# Patient Record
Sex: Male | Born: 1940 | Race: White | Hispanic: No | Marital: Married | Smoking: Never smoker
Health system: Southern US, Community
[De-identification: ages and names within clinical notes are randomized; demographics above are authoritative.]

## PROBLEM LIST (undated history)

## (undated) ENCOUNTER — Encounter

## (undated) ENCOUNTER — Telehealth

## (undated) DIAGNOSIS — E785 Hyperlipidemia, unspecified: Secondary | ICD-10-CM

## (undated) DIAGNOSIS — I10 Essential (primary) hypertension: Secondary | ICD-10-CM

## (undated) DIAGNOSIS — M5416 Radiculopathy, lumbar region: Secondary | ICD-10-CM

## (undated) DIAGNOSIS — R7301 Impaired fasting glucose: Secondary | ICD-10-CM

## (undated) HISTORY — DX: Hyperlipidemia, unspecified: E78.5

## (undated) HISTORY — DX: Radiculopathy, lumbar region: M54.16

## (undated) HISTORY — DX: Essential (primary) hypertension: I10

## (undated) HISTORY — PX: COLONOSCOPY: SHX174

## (undated) HISTORY — DX: Impaired fasting glucose: R73.01

---

## 1996-11-21 HISTORY — PX: TOTAL HIP ARTHROPLASTY: SHX124

## 2000-09-06 ENCOUNTER — Encounter (INDEPENDENT_AMBULATORY_CARE_PROVIDER_SITE_OTHER): Payer: Self-pay | Admitting: Specialist

## 2000-09-06 ENCOUNTER — Other Ambulatory Visit: Admission: RE | Admit: 2000-09-06 | Discharge: 2000-09-06 | Payer: Self-pay | Admitting: Gastroenterology

## 2001-07-31 ENCOUNTER — Encounter: Payer: Self-pay | Admitting: Emergency Medicine

## 2001-07-31 ENCOUNTER — Emergency Department (HOSPITAL_COMMUNITY): Admission: EM | Admit: 2001-07-31 | Discharge: 2001-07-31 | Payer: Self-pay | Admitting: Emergency Medicine

## 2003-07-30 ENCOUNTER — Encounter: Admission: RE | Admit: 2003-07-30 | Discharge: 2003-07-30 | Payer: Self-pay | Admitting: Endocrinology

## 2003-07-30 ENCOUNTER — Encounter: Payer: Self-pay | Admitting: Endocrinology

## 2003-08-19 ENCOUNTER — Encounter: Admission: RE | Admit: 2003-08-19 | Discharge: 2003-08-19 | Payer: Self-pay | Admitting: Endocrinology

## 2003-08-19 ENCOUNTER — Encounter: Payer: Self-pay | Admitting: Endocrinology

## 2004-04-28 ENCOUNTER — Emergency Department (HOSPITAL_COMMUNITY): Admission: EM | Admit: 2004-04-28 | Discharge: 2004-04-28 | Payer: Self-pay | Admitting: Family Medicine

## 2004-12-23 ENCOUNTER — Ambulatory Visit: Payer: Self-pay | Admitting: Internal Medicine

## 2005-04-01 ENCOUNTER — Ambulatory Visit: Payer: Self-pay | Admitting: Internal Medicine

## 2005-07-27 ENCOUNTER — Ambulatory Visit: Payer: Self-pay | Admitting: Internal Medicine

## 2005-08-03 ENCOUNTER — Ambulatory Visit: Payer: Self-pay | Admitting: Internal Medicine

## 2005-08-15 ENCOUNTER — Ambulatory Visit: Payer: Self-pay | Admitting: Internal Medicine

## 2005-12-16 ENCOUNTER — Emergency Department (HOSPITAL_COMMUNITY): Admission: EM | Admit: 2005-12-16 | Discharge: 2005-12-16 | Payer: Self-pay | Admitting: Family Medicine

## 2006-05-16 ENCOUNTER — Ambulatory Visit: Payer: Self-pay | Admitting: Internal Medicine

## 2006-07-31 ENCOUNTER — Ambulatory Visit: Payer: Self-pay | Admitting: Internal Medicine

## 2007-05-02 ENCOUNTER — Encounter: Admission: RE | Admit: 2007-05-02 | Discharge: 2007-05-02 | Payer: Self-pay | Admitting: Sports Medicine

## 2007-05-29 ENCOUNTER — Encounter: Admission: RE | Admit: 2007-05-29 | Discharge: 2007-05-29 | Payer: Self-pay | Admitting: Sports Medicine

## 2007-06-18 ENCOUNTER — Encounter: Admission: RE | Admit: 2007-06-18 | Discharge: 2007-06-18 | Payer: Self-pay | Admitting: Sports Medicine

## 2007-06-25 ENCOUNTER — Encounter: Admission: RE | Admit: 2007-06-25 | Discharge: 2007-06-25 | Payer: Self-pay | Admitting: Sports Medicine

## 2007-08-08 ENCOUNTER — Ambulatory Visit: Payer: Self-pay | Admitting: Internal Medicine

## 2007-08-08 DIAGNOSIS — M109 Gout, unspecified: Secondary | ICD-10-CM | POA: Insufficient documentation

## 2007-08-08 LAB — CONVERTED CEMR LAB
Cholesterol, target level: 200 mg/dL
HDL goal, serum: 40 mg/dL
LDL Goal: 160 mg/dL

## 2007-08-09 ENCOUNTER — Telehealth (INDEPENDENT_AMBULATORY_CARE_PROVIDER_SITE_OTHER): Payer: Self-pay | Admitting: *Deleted

## 2007-08-09 ENCOUNTER — Encounter: Payer: Self-pay | Admitting: Internal Medicine

## 2007-08-11 LAB — CONVERTED CEMR LAB
ALT: 17 units/L (ref 0–53)
AST: 24 units/L (ref 0–37)
Alkaline Phosphatase: 63 units/L (ref 39–117)
BUN: 15 mg/dL (ref 6–23)
Basophils Relative: 0.4 % (ref 0.0–1.0)
CO2: 31 meq/L (ref 19–32)
Calcium: 9.4 mg/dL (ref 8.4–10.5)
Cholesterol: 107 mg/dL (ref 0–200)
Creatinine,U: 86 mg/dL
Eosinophils Absolute: 0 10*3/uL (ref 0.0–0.6)
GFR calc Af Amer: 96 mL/min
GFR calc non Af Amer: 80 mL/min
Glucose, Bld: 100 mg/dL — ABNORMAL HIGH (ref 70–99)
HDL: 35.7 mg/dL — ABNORMAL LOW (ref >39.0)
Hemoglobin: 12.7 g/dL — ABNORMAL LOW (ref 13.0–17.0)
Lymphocytes Relative: 27.5 % (ref 12.0–46.0)
MCHC: 34.6 g/dL (ref 30.0–36.0)
MCV: 96.5 fL (ref 78.0–100.0)
Monocytes Absolute: 0.3 10*3/uL (ref 0.2–0.7)
Monocytes Relative: 7.4 % (ref 3.0–11.0)
Neutro Abs: 3 10*3/uL (ref 1.4–7.7)
PSA: 1.56 ng/mL (ref 0.10–4.00)
Platelets: 159 10*3/uL (ref 150–400)
Potassium: 4.5 meq/L (ref 3.5–5.1)
TSH: 1.94 microintl units/mL (ref 0.35–5.50)
Total Bilirubin: 0.8 mg/dL (ref 0.3–1.2)
Triglycerides: 67 mg/dL (ref 0–149)
Uric Acid, Serum: 4.2 mg/dL (ref 2.4–7.0)

## 2007-08-13 ENCOUNTER — Encounter (INDEPENDENT_AMBULATORY_CARE_PROVIDER_SITE_OTHER): Payer: Self-pay | Admitting: *Deleted

## 2007-08-15 ENCOUNTER — Ambulatory Visit: Payer: Self-pay | Admitting: Internal Medicine

## 2007-08-16 ENCOUNTER — Encounter (INDEPENDENT_AMBULATORY_CARE_PROVIDER_SITE_OTHER): Payer: Self-pay | Admitting: *Deleted

## 2007-08-30 ENCOUNTER — Encounter (INDEPENDENT_AMBULATORY_CARE_PROVIDER_SITE_OTHER): Payer: Self-pay | Admitting: *Deleted

## 2007-08-30 ENCOUNTER — Encounter: Payer: Self-pay | Admitting: Internal Medicine

## 2007-09-07 ENCOUNTER — Telehealth (INDEPENDENT_AMBULATORY_CARE_PROVIDER_SITE_OTHER): Payer: Self-pay | Admitting: *Deleted

## 2007-10-17 ENCOUNTER — Ambulatory Visit: Payer: Self-pay | Admitting: Internal Medicine

## 2007-10-17 LAB — CONVERTED CEMR LAB
ALT: 25 units/L (ref 0–53)
Calcium: 9.6 mg/dL (ref 8.4–10.5)
Hgb A1c MFr Bld: 5.5 % (ref 4.6–6.0)

## 2007-10-19 ENCOUNTER — Ambulatory Visit: Payer: Self-pay | Admitting: Internal Medicine

## 2007-10-19 DIAGNOSIS — I10 Essential (primary) hypertension: Secondary | ICD-10-CM | POA: Insufficient documentation

## 2007-10-19 DIAGNOSIS — E785 Hyperlipidemia, unspecified: Secondary | ICD-10-CM | POA: Insufficient documentation

## 2007-10-19 DIAGNOSIS — IMO0002 Reserved for concepts with insufficient information to code with codable children: Secondary | ICD-10-CM | POA: Insufficient documentation

## 2007-10-21 LAB — CONVERTED CEMR LAB
Free T4: 0.8 ng/dL (ref 0.6–1.6)
TSH: 2.42 microintl units/mL (ref 0.35–5.50)

## 2007-10-22 ENCOUNTER — Encounter (INDEPENDENT_AMBULATORY_CARE_PROVIDER_SITE_OTHER): Payer: Self-pay | Admitting: *Deleted

## 2008-01-18 ENCOUNTER — Telehealth (INDEPENDENT_AMBULATORY_CARE_PROVIDER_SITE_OTHER): Payer: Self-pay | Admitting: *Deleted

## 2008-04-29 ENCOUNTER — Telehealth (INDEPENDENT_AMBULATORY_CARE_PROVIDER_SITE_OTHER): Payer: Self-pay | Admitting: *Deleted

## 2008-05-19 ENCOUNTER — Ambulatory Visit: Payer: Self-pay | Admitting: Internal Medicine

## 2008-05-19 LAB — CONVERTED CEMR LAB
ALT: 16 units/L (ref 0–53)
AST: 22 units/L (ref 0–37)
Albumin: 3.7 g/dL (ref 3.5–5.2)
Cholesterol: 150 mg/dL (ref 0–200)
HDL: 37.6 mg/dL — ABNORMAL LOW (ref >39.0)
Hgb A1c MFr Bld: 5.8 % (ref 4.6–6.0)
Total Protein: 6.8 g/dL (ref 6.0–8.3)
Triglycerides: 54 mg/dL (ref 0–149)

## 2008-05-26 ENCOUNTER — Ambulatory Visit: Payer: Self-pay | Admitting: Internal Medicine

## 2008-06-10 ENCOUNTER — Encounter: Payer: Self-pay | Admitting: Internal Medicine

## 2008-06-24 ENCOUNTER — Telehealth (INDEPENDENT_AMBULATORY_CARE_PROVIDER_SITE_OTHER): Payer: Self-pay | Admitting: *Deleted

## 2008-07-21 ENCOUNTER — Telehealth (INDEPENDENT_AMBULATORY_CARE_PROVIDER_SITE_OTHER): Payer: Self-pay | Admitting: *Deleted

## 2008-10-17 ENCOUNTER — Ambulatory Visit: Payer: Self-pay | Admitting: Internal Medicine

## 2008-10-17 LAB — CONVERTED CEMR LAB
ALT: 17 units/L (ref 0–53)
AST: 21 units/L (ref 0–37)
Bilirubin, Direct: 0.1 mg/dL (ref 0.0–0.3)
PSA: 1.57 ng/mL (ref 0.10–4.00)
Total Bilirubin: 0.8 mg/dL (ref 0.3–1.2)
Total CHOL/HDL Ratio: 3.4

## 2008-10-27 ENCOUNTER — Ambulatory Visit: Payer: Self-pay | Admitting: Internal Medicine

## 2008-10-27 DIAGNOSIS — R3589 Other polyuria: Secondary | ICD-10-CM | POA: Insufficient documentation

## 2008-10-27 DIAGNOSIS — R358 Other polyuria: Secondary | ICD-10-CM

## 2008-11-02 LAB — CONVERTED CEMR LAB
Creatinine,U: 66.4 mg/dL
Hemoglobin, Urine: NEGATIVE
Hgb A1c MFr Bld: 5.8 % (ref 4.6–6.0)
Leukocytes, UA: NEGATIVE
Mucus, UA: NEGATIVE
Nitrite: NEGATIVE
Squamous Epithelial / LPF: NEGATIVE /lpf
Total Protein, Urine: NEGATIVE mg/dL
WBC, UA: NONE SEEN cells/hpf
pH: 7.5 (ref 5.0–8.0)

## 2008-11-03 ENCOUNTER — Encounter (INDEPENDENT_AMBULATORY_CARE_PROVIDER_SITE_OTHER): Payer: Self-pay | Admitting: *Deleted

## 2008-11-09 ENCOUNTER — Inpatient Hospital Stay (HOSPITAL_COMMUNITY): Admission: AD | Admit: 2008-11-09 | Discharge: 2008-11-17 | Payer: Self-pay | Admitting: Orthopedic Surgery

## 2008-11-09 ENCOUNTER — Emergency Department (HOSPITAL_COMMUNITY): Admission: EM | Admit: 2008-11-09 | Discharge: 2008-11-09 | Payer: Self-pay | Admitting: Emergency Medicine

## 2008-11-17 ENCOUNTER — Encounter: Payer: Self-pay | Admitting: Internal Medicine

## 2008-11-18 ENCOUNTER — Telehealth (INDEPENDENT_AMBULATORY_CARE_PROVIDER_SITE_OTHER): Payer: Self-pay | Admitting: *Deleted

## 2008-12-24 ENCOUNTER — Encounter: Payer: Self-pay | Admitting: Internal Medicine

## 2009-01-22 ENCOUNTER — Telehealth (INDEPENDENT_AMBULATORY_CARE_PROVIDER_SITE_OTHER): Payer: Self-pay | Admitting: *Deleted

## 2009-04-02 ENCOUNTER — Telehealth (INDEPENDENT_AMBULATORY_CARE_PROVIDER_SITE_OTHER): Payer: Self-pay | Admitting: *Deleted

## 2009-05-17 IMAGING — CR DG FEMUR 2+V*R*
5 series · 5 of 5 positions shown · non-contrast
Comparison: 11/09/2008

CLINICAL DATA: Fracture proximal right femur

RIGHT FEMUR - 2 VIEW

[w hip lateral right * (1 of 3)]
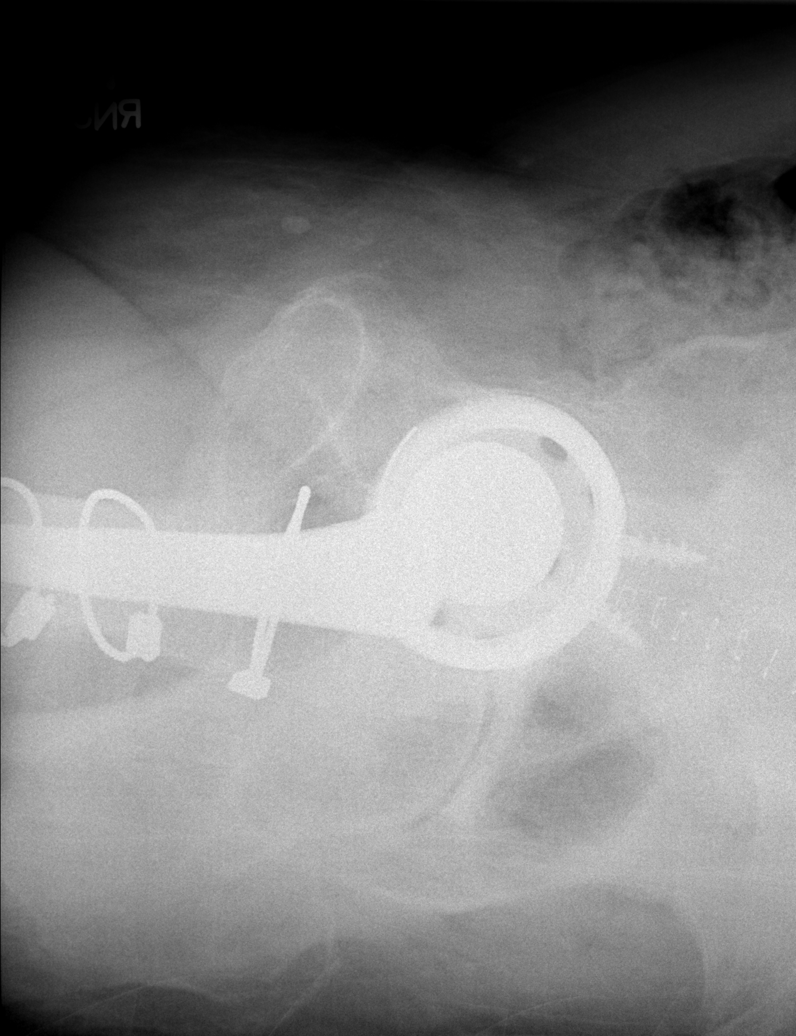

[w hip lateral right * (2 of 3)]
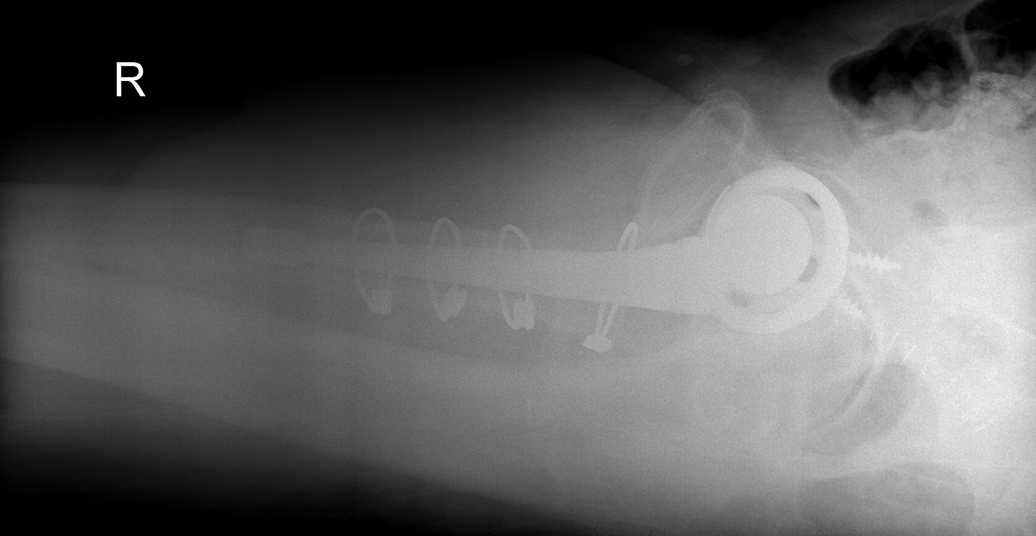

[w hip lateral right * (3 of 3)]
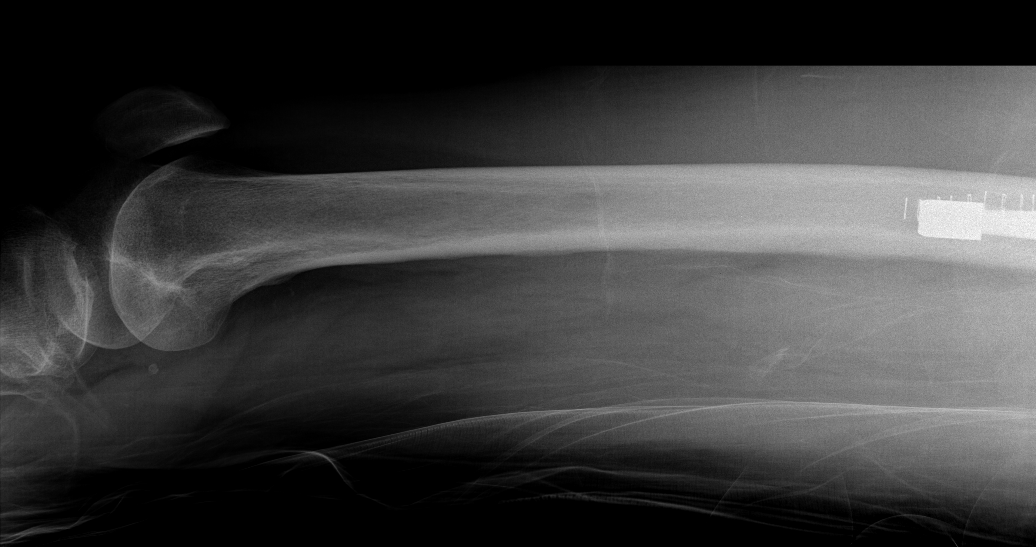

[t femur with hip  ap right]
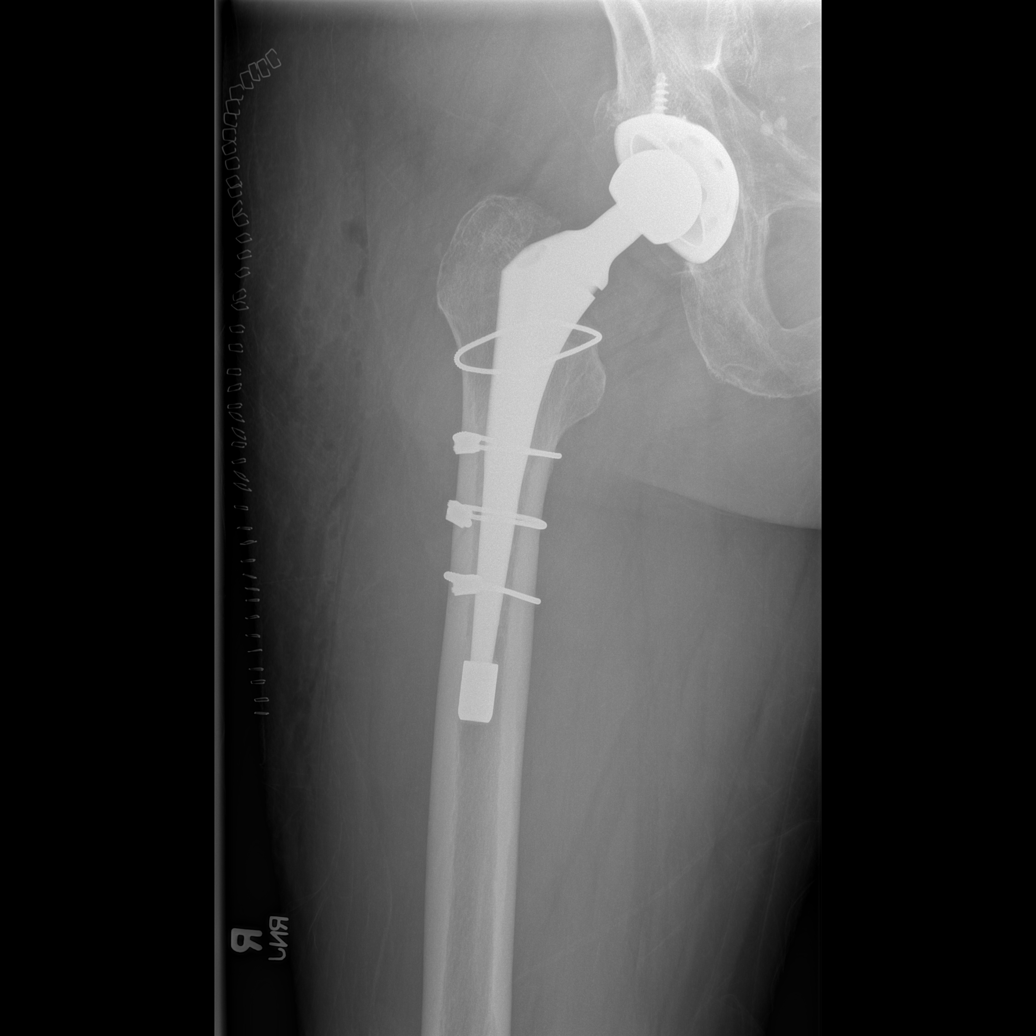

[t femur with knee ap right]
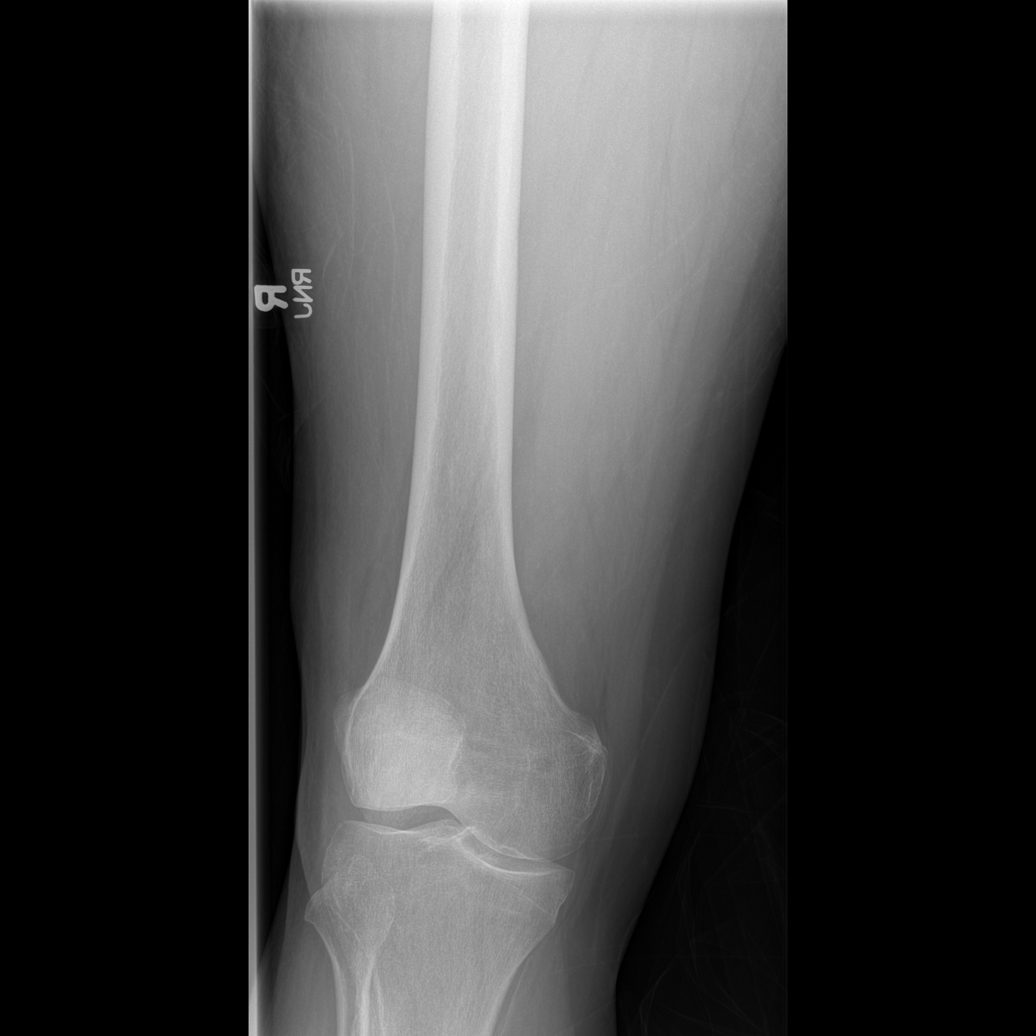

[5 of 5 positions shown; findings below may reference images not displayed]

FINDINGS: The fracture of the proximal right femoral shaft has been
reduced.  There are four cerclage wires encircling the proximal
right femur, the most proximal of which is in the intertrochanteric
region and three around the shaft of the femur.  Prosthetic
components are intact.  Satisfactory position and alignment of the
fracture fragments.
IMPRESSION: Satisfactory reduction of the proximal right femoral shaft
fracture.

## 2009-06-01 ENCOUNTER — Ambulatory Visit: Payer: Self-pay | Admitting: Internal Medicine

## 2009-06-06 LAB — CONVERTED CEMR LAB
Creatinine,U: 38.4 mg/dL
Hemoglobin, Urine: NEGATIVE
Leukocytes, UA: NEGATIVE
Microalb Creat Ratio: 2.6 mg/g (ref 0.0–30.0)
Microalb, Ur: 0.1 mg/dL (ref 0.0–1.9)
Nitrite: NEGATIVE
Specific Gravity, Urine: <=1.005 (ref 1.000–1.030)
Urine Glucose: NEGATIVE mg/dL
Urobilinogen, UA: 0.2 (ref 0.0–1.0)

## 2009-06-08 ENCOUNTER — Encounter (INDEPENDENT_AMBULATORY_CARE_PROVIDER_SITE_OTHER): Payer: Self-pay | Admitting: *Deleted

## 2009-07-22 ENCOUNTER — Telehealth (INDEPENDENT_AMBULATORY_CARE_PROVIDER_SITE_OTHER): Payer: Self-pay | Admitting: *Deleted

## 2009-07-24 ENCOUNTER — Telehealth (INDEPENDENT_AMBULATORY_CARE_PROVIDER_SITE_OTHER): Payer: Self-pay | Admitting: *Deleted

## 2009-11-09 ENCOUNTER — Ambulatory Visit: Payer: Self-pay | Admitting: Internal Medicine

## 2009-11-15 LAB — CONVERTED CEMR LAB
Albumin: 3.6 g/dL (ref 3.5–5.2)
BUN: 17 mg/dL (ref 6–23)
Cholesterol: 155 mg/dL (ref 0–200)
Creatinine, Ser: 0.9 mg/dL (ref 0.4–1.5)
HDL: 42.7 mg/dL (ref >39.00)
PSA: 2.05 ng/mL (ref 0.10–4.00)
Potassium: 4.4 meq/L (ref 3.5–5.1)
Total CHOL/HDL Ratio: 4
Total Protein: 6.3 g/dL (ref 6.0–8.3)
Triglycerides: 51 mg/dL (ref 0.0–149.0)

## 2009-11-16 ENCOUNTER — Encounter (INDEPENDENT_AMBULATORY_CARE_PROVIDER_SITE_OTHER): Payer: Self-pay | Admitting: *Deleted

## 2009-11-27 ENCOUNTER — Telehealth (INDEPENDENT_AMBULATORY_CARE_PROVIDER_SITE_OTHER): Payer: Self-pay | Admitting: *Deleted

## 2009-12-25 ENCOUNTER — Telehealth (INDEPENDENT_AMBULATORY_CARE_PROVIDER_SITE_OTHER): Payer: Self-pay | Admitting: *Deleted

## 2010-01-15 ENCOUNTER — Telehealth: Payer: Self-pay | Admitting: Internal Medicine

## 2010-01-25 ENCOUNTER — Telehealth (INDEPENDENT_AMBULATORY_CARE_PROVIDER_SITE_OTHER): Payer: Self-pay | Admitting: *Deleted

## 2010-02-11 ENCOUNTER — Telehealth (INDEPENDENT_AMBULATORY_CARE_PROVIDER_SITE_OTHER): Payer: Self-pay | Admitting: *Deleted

## 2010-02-26 ENCOUNTER — Telehealth: Payer: Self-pay | Admitting: Internal Medicine

## 2010-03-31 ENCOUNTER — Telehealth (INDEPENDENT_AMBULATORY_CARE_PROVIDER_SITE_OTHER): Payer: Self-pay | Admitting: *Deleted

## 2010-04-22 ENCOUNTER — Telehealth (INDEPENDENT_AMBULATORY_CARE_PROVIDER_SITE_OTHER): Payer: Self-pay | Admitting: *Deleted

## 2010-05-28 ENCOUNTER — Ambulatory Visit: Payer: Self-pay | Admitting: Internal Medicine

## 2010-06-07 LAB — CONVERTED CEMR LAB: Hgb A1c MFr Bld: 5.8 % (ref 4.6–6.5)

## 2010-06-29 ENCOUNTER — Ambulatory Visit: Payer: Self-pay | Admitting: Internal Medicine

## 2010-06-29 DIAGNOSIS — E119 Type 2 diabetes mellitus without complications: Secondary | ICD-10-CM | POA: Insufficient documentation

## 2010-06-30 ENCOUNTER — Telehealth (INDEPENDENT_AMBULATORY_CARE_PROVIDER_SITE_OTHER): Payer: Self-pay | Admitting: *Deleted

## 2010-07-09 ENCOUNTER — Telehealth: Payer: Self-pay | Admitting: Internal Medicine

## 2010-07-21 ENCOUNTER — Encounter (INDEPENDENT_AMBULATORY_CARE_PROVIDER_SITE_OTHER): Payer: Self-pay | Admitting: *Deleted

## 2010-07-23 ENCOUNTER — Telehealth (INDEPENDENT_AMBULATORY_CARE_PROVIDER_SITE_OTHER): Payer: Self-pay | Admitting: *Deleted

## 2010-09-13 ENCOUNTER — Telehealth (INDEPENDENT_AMBULATORY_CARE_PROVIDER_SITE_OTHER): Payer: Self-pay | Admitting: *Deleted

## 2010-10-07 ENCOUNTER — Encounter (INDEPENDENT_AMBULATORY_CARE_PROVIDER_SITE_OTHER): Payer: Self-pay | Admitting: *Deleted

## 2010-10-11 ENCOUNTER — Telehealth (INDEPENDENT_AMBULATORY_CARE_PROVIDER_SITE_OTHER): Payer: Self-pay | Admitting: *Deleted

## 2010-10-13 ENCOUNTER — Ambulatory Visit: Payer: Self-pay | Admitting: Internal Medicine

## 2010-10-13 DIAGNOSIS — R351 Nocturia: Secondary | ICD-10-CM | POA: Insufficient documentation

## 2010-10-13 DIAGNOSIS — R3915 Urgency of urination: Secondary | ICD-10-CM | POA: Insufficient documentation

## 2010-10-13 DIAGNOSIS — R7309 Other abnormal glucose: Secondary | ICD-10-CM | POA: Insufficient documentation

## 2010-10-13 DIAGNOSIS — R350 Frequency of micturition: Secondary | ICD-10-CM | POA: Insufficient documentation

## 2010-10-13 LAB — CONVERTED CEMR LAB
Blood in Urine, dipstick: NEGATIVE
Glucose, Urine, Semiquant: NEGATIVE
Ketones, urine, test strip: NEGATIVE
Nitrite: NEGATIVE
Specific Gravity, Urine: 1.01
pH: 6.5

## 2010-10-18 LAB — CONVERTED CEMR LAB: Hgb A1c MFr Bld: 5.7 % (ref 4.6–6.5)

## 2010-10-21 ENCOUNTER — Telehealth (INDEPENDENT_AMBULATORY_CARE_PROVIDER_SITE_OTHER): Payer: Self-pay | Admitting: *Deleted

## 2010-12-02 ENCOUNTER — Telehealth (INDEPENDENT_AMBULATORY_CARE_PROVIDER_SITE_OTHER): Payer: Self-pay | Admitting: *Deleted

## 2010-12-21 NOTE — Progress Notes (Signed)
Summary: Refill Request  Phone Note Refill Request Message from:  Pharmacy on [facility] Fax #: [phone]  Refills Requested: Medication #1:  CELEBREX 200 MG  CAPS 1 by mouth as needed ONLY   Dosage confirmed as above?Dosage Confirmed   Supply Requested: 1 month   Last Refilled: 01/25/2010 Next Appointment Scheduled: none Initial call taken by: Harold Barban,  February 26, 2010 8:38 AM  Follow-up for Phone Call        Dr.Hopper this was filled 1 month ago # 30, patient must be using daily. Is it ok to keep refilling med?? the indications is on the script (Not for regular use) Follow-up by: Shonna Chock,  February 26, 2010 9:32 AM  Additional Follow-up for Phone Call Additional follow up Details #1::        refill but CAPITALIZE as needed only due to long term cardiac & stomach risks Additional Follow-up by: Marga Melnick MD,  February 26, 2010 12:56 PM    New/Updated Medications: CELEBREX 200 MG  CAPS (CELECOXIB) 1 by mouth as needed ONLY, **AVOID REGULAR USE DUE TO INCREASE HEART AND STOMACH RISK** Prescriptions: CELEBREX 200 MG  CAPS (CELECOXIB) 1 by mouth as needed ONLY, **AVOID REGULAR USE DUE TO INCREASE HEART AND STOMACH RISK**  #30 x 0   Entered by:   Shonna Chock   Authorized by:   Marga Melnick MD   Signed by:   Shonna Chock on 02/26/2010   Method used:   Electronically to        [facility] (retail)       [address]       [place], [place]  [address]       Ph: [phone]       Fax: [phone]   RxID:   [id number]

## 2010-12-21 NOTE — Progress Notes (Signed)
Summary: Refill Request  Phone Note Refill Request Call back at [phone] Message from:  Pharmacy on April 22, 2010 10:09 AM  Refills Requested: Medication #1:  CELEBREX 200 MG  CAPS 1 by mouth as needed ONLY   Dosage confirmed as above?Dosage Confirmed   Supply Requested: 1 month   Last Refilled: 04/01/2010 [facility]  Next Appointment Scheduled: none Initial call taken by: Harold Barban,  April 22, 2010 10:09 AM    Prescriptions: CELEBREX 200 MG  CAPS (CELECOXIB) 1 by mouth as needed ONLY, **AVOID REGULAR USE DUE TO INCREASE HEART AND STOMACH RISK**  #60 x 1   Entered by:   Shonna Chock   Authorized by:   Marga Melnick MD   Signed by:   Shonna Chock on 04/22/2010   Method used:   Electronically to        [facility] (retail)       [address]       [place], [place]  [address]       Ph: [phone]       Fax: [phone]   RxID:   [id number]

## 2010-12-21 NOTE — Progress Notes (Signed)
Summary: RX---  Phone Note Refill Request   Refills Requested: Medication #1:  CELEBREX 200 MG  CAPS 1 by mouth once daily as needed only - avoid regular use due to GI [facility]--[phone]  Initial call taken by: Freddy Jaksch,  November 27, 2009 9:26 AM    Prescriptions: CELEBREX 200 MG  CAPS (CELECOXIB) 1 by mouth once daily as needed only - avoid regular use due to GI, cardiac risk longterm  #30 x 0   Entered by:   Kandice Hams   Authorized by:   Marga Melnick MD   Signed by:   Kandice Hams on 11/27/2009   Method used:   Faxed to ...       [facility] (retail)       [address]       [place], [place]  [address]       Ph: [phone]       Fax: [phone]   RxID:   [id number]

## 2010-12-21 NOTE — Letter (Signed)
Summary: Colonoscopy Letter  [facility]  [address] [place], [place] [address]   Phone: [phone]  Fax: [phone]      July 21, 2010 MRN: [id number]   FIDEL CAGGIANO [address] [place], [place]  [address]   Dear Mr. Mayon,   According to your medical record, it is time for you to schedule a Colonoscopy. The American Cancer Society recommends this procedure as a method to detect early colon cancer. Patients with a family history of colon cancer, or a personal history of colon polyps or inflammatory bowel disease are at increased risk.  This letter has beeen generated based on the recommendations made at the time of your procedure. If you feel that in your particular situation this may no longer apply, please contact our office.  Please call our office at [phone] to schedule this appointment or to update your records at your earliest convenience.  Thank you for cooperating with [place] to provide you with the very best care possible.   Sincerely,  Judie Petit T. Russella Dar, M.D.  [facility] [phone]

## 2010-12-21 NOTE — Letter (Signed)
Summary: Pre Visit Letter Revised  [facility]  [address] [place], [place] [address]   Phone: [phone]  Fax: [phone]        10/07/2010 Carl Wilcox Carl Wilcox [address] [place], [place]  [address]             Procedure Date:  11-10-10   Welcome to the Gastroenterology Division at [facility].    You are scheduled to see a nurse for your pre-procedure visit on 11-05-10 at 2:30p.m. on the 3rd floor at [facility], [address]  We ask that you try to arrive at our office 15 minutes prior to your appointment time to allow for check-in.  Please take a minute to review the attached form.  If you answer "Yes" to one or more of the questions on the first page, we ask that you call the person listed at your earliest opportunity.  If you answer "No" to all of the questions, please complete the rest of the form and bring it to your appointment.    Your nurse visit will consist of discussing your medical and surgical history, your immediate family medical history, and your medications.   If you are unable to list all of your medications on the form, please bring the medication bottles to your appointment and we will list them.  We will need to be aware of both prescribed and over the counter drugs.  We will need to know exact dosage information as well.    Please be prepared to read and sign documents such as consent forms, a financial agreement, and acknowledgement forms.  If necessary, and with your consent, a friend or relative is welcome to sit-in on the nurse visit with you.  Please bring your insurance card so that we may make a copy of it.  If your insurance requires a referral to see a specialist, please bring your referral form from your primary care physician.  No co-pay is required for this nurse visit.     If you cannot keep your appointment, please call [phone] to cancel or reschedule prior to your appointment date.  This allows  [place] the opportunity to schedule an appointment for another patient in need of care.    Thank you for choosing [facility] for your medical needs.  We appreciate the opportunity to care for you.  Please visit [place] at our website  to learn more about our practice.  Sincerely, The Gastroenterology Division

## 2010-12-21 NOTE — Progress Notes (Signed)
Summary: Refill-Request for (Celebrex)  Phone Note Refill Request Message from:  Fax from Pharmacy  Refills Requested: Medication #1:  CELEBREX 200 MG  CAPS 1 by mouth as needed ONLY [facility] - fax [phone] -- ph [phone]   Method Requested: Fax to Local Pharmacy Next Appointment Scheduled: none Initial call taken by: Okey Regal [place],  Mar 31, 2010 11:32 AM  Follow-up for Phone Call        Patient gets this filled every month, are you ok with #60 and refills? Follow-up by: Shonna Chock,  Mar 31, 2010 11:37 AM  Additional Follow-up for Phone Call Additional follow up Details #1::        OK but label "Routine use should be avoided due to GI & cardiac risks . It should taken as needed only to decrease long tewrm risks" Additional Follow-up by: Marga Melnick MD,  Mar 31, 2010 4:52 PM    Prescriptions: CELEBREX 200 MG  CAPS (CELECOXIB) 1 by mouth as needed ONLY, **AVOID REGULAR USE DUE TO INCREASE HEART AND STOMACH RISK**  #60 x 0   Entered by:   Shonna Chock   Authorized by:   Marga Melnick MD   Signed by:   Shonna Chock on 04/01/2010   Method used:   Electronically to        [facility] (retail)       [address]       [place], [place]  [address]       Ph: [phone]       Fax: [phone]   RxID:   [id number]

## 2010-12-21 NOTE — Progress Notes (Signed)
Summary: Losartan potassium refill  Phone Note Refill Request Message from:  Fax from Pharmacy on October 11, 2010 10:05 AM  Refills Requested: Medication #1:  COZAAR 50 MG  TABS 1 qd   Last Refilled: 07/09/2010 [facility], [address], [place]  [place], fax [phone]  Next Appointment Scheduled: wed 10/13/2010 Initial call taken by: Jerolyn Shin,  October 11, 2010 10:06 AM    Prescriptions: COZAAR 50 MG  TABS (LOSARTAN POTASSIUM) 1 qd  #90 x 1   Entered by:   Shonna Chock CMA   Authorized by:   Marga Melnick MD   Signed by:   Shonna Chock CMA on 10/11/2010   Method used:   Electronically to        [facility] (retail)       [address]       [place], [place]  [address]       Ph: [phone]       Fax: [phone]   RxID:   [id number]

## 2010-12-21 NOTE — Progress Notes (Signed)
Summary: Refill Request (Appointment Due)  Phone Note Refill Request Message from:  Fax from Pharmacy on February 11, 2010 2:22 PM  Refills Requested: Medication #1:  ACTOS 30 MG  TABS take one tablet daily [facility] fax [phone]   Method Requested: Fax to Local Pharmacy Next Appointment Scheduled: no appt Initial call taken by: Barb Merino,  February 11, 2010 2:23 PM  Follow-up for Phone Call        Left message on VM for patient to call to schedule appointment per last lab values from 10/2009, A1c was in a NON diabetic range and Dr.Hopper will need to adjust meds.   **Actos may be 1/2 or D/C'ed** Follow-up by: Shonna Chock,  February 11, 2010 3:05 PM  Additional Follow-up for Phone Call Additional follow up Details #1::        Left another message informing patient to please call to schedule appointment before refilling meds Additional Follow-up by: Shonna Chock,  February 12, 2010 9:50 AM

## 2010-12-21 NOTE — Progress Notes (Signed)
Summary: REFILL REQUEST  Phone Note Refill Request Call back at [phone] Message from:  Pharmacy on July 09, 2010 10:01 AM  Refills Requested: Medication #1:  COZAAR 50 MG  TABS 1 qd   Dosage confirmed as above?Dosage Confirmed   Supply Requested: 3 months [facility]  Next Appointment Scheduled: NONE Initial call taken by: Lavell Islam,  July 09, 2010 10:02 AM    Prescriptions: COZAAR 50 MG  TABS (LOSARTAN POTASSIUM) 1 qd  #90 x 0   Entered by:   Lucious Groves CMA   Authorized by:   Marga Melnick MD   Signed by:   Lucious Groves CMA on 07/09/2010   Method used:   Faxed to ...       [facility] (retail)       [address]       [place], [place]  [address]       Ph: [phone]       Fax: [phone]   RxID:   [id number]

## 2010-12-21 NOTE — Progress Notes (Signed)
Summary: Refill Request  Phone Note Refill Request Message from:  [facility] # [phone]  Refills Requested: Medication #1:  CELEBREX 200 MG  CAPS 1 by mouth once daily as needed only - avoid regular use due to GI   Dosage confirmed as above?Dosage Confirmed   Last Refilled: 11/27/2009 Initial call taken by: Harold Barban,  December 25, 2009 9:24 AM    New/Updated Medications: CELEBREX 200 MG  CAPS (CELECOXIB) 1 by mouth as needed ONLY, AVOID REGULAR USE DUE TO INCREASE HEART AND STOMACH RISK Prescriptions: CELEBREX 200 MG  CAPS (CELECOXIB) 1 by mouth as needed ONLY, AVOID REGULAR USE DUE TO INCREASE HEART AND STOMACH RISK  #30 x 0   Entered by:   Shonna Chock   Authorized by:   Marga Melnick MD   Signed by:   Shonna Chock on 12/25/2009   Method used:   Electronically to        [facility] (retail)       [address]       [place], [place]  [id number]       Ph: [phone]       Fax: [phone]   RxID:   [id number]

## 2010-12-21 NOTE — Progress Notes (Signed)
Summary: refill  Phone Note Refill Request Message from:  Fax from Pharmacy on July 23, 2010 1:56 PM  Refills Requested: Medication #1:  SIMVASTATIN 40 MG  TABS 1 qhs. [facility] Valinda Hoar [id number]  Initial call taken by: Okey Regal [place],  July 23, 2010 1:56 PM    Prescriptions: SIMVASTATIN 40 MG  TABS (SIMVASTATIN) 1 qhs  #90 Tablet x 0   Entered by:   Shonna Chock CMA   Authorized by:   Marga Melnick MD   Signed by:   Shonna Chock CMA on 07/23/2010   Method used:   Electronically to        [facility] (retail)       [address]       [place], [place]  [id number]       Ph: [phone]       Fax: [phone]   RxID:   [id number]

## 2010-12-21 NOTE — Progress Notes (Signed)
Summary: refill  Phone Note Refill Request Message from:  Fax from Pharmacy on January 25, 2010 9:04 AM  Refills Requested: Medication #1:  SIMVASTATIN 40 MG  TABS 1 qhs.  Medication #2:  CELEBREX 200 MG  CAPS 1 by mouth as needed ONLY [facility] fax [phone]   Method Requested: Fax to Local Pharmacy Next Appointment Scheduled: no appt Initial call taken by: Barb Merino,  January 25, 2010 9:05 AM    Prescriptions: CELEBREX 200 MG  CAPS (CELECOXIB) 1 by mouth as needed ONLY, AVOID REGULAR USE DUE TO INCREASE HEART AND STOMACH RISK  #30 x 0   Entered by:   Shonna Chock   Authorized by:   Marga Melnick MD   Signed by:   Shonna Chock on 01/25/2010   Method used:   Electronically to        [facility] (retail)       [address]       [place], [place]  [address]       Ph: [phone]       Fax: [phone]   RxID:   [id number] SIMVASTATIN 40 MG  TABS (SIMVASTATIN) 1 qhs  #90 Tablet x 1   Entered by:   Shonna Chock   Authorized by:   Marga Melnick MD   Signed by:   Shonna Chock on 01/25/2010   Method used:   Electronically to        [facility] (retail)       [address]       [place], [place]  [address]       Ph: [phone]       Fax: [phone]   RxID:   [id number]

## 2010-12-21 NOTE — Assessment & Plan Note (Signed)
Summary: frequent urination/cbs   Vital Signs:  Patient profile:   70 year old male Weight:      206 pounds Temp:     98.4 degrees F oral Pulse rate:   60 / minute Resp:     16 per minute BP sitting:   118 / 80  (left arm) Cuff size:   large  Vitals Entered By: Shonna Chock CMA (October 13, 2010 8:09 AM) CC: Frequent Urination x 1 year, Dysuria, Type 2 diabetes mellitus follow-up   CC:  Frequent Urination x 1 year, Dysuria, and Type 2 diabetes mellitus follow-up.  History of Present Illness:      This is a 70 year old man who presents with frequency X 12 months, worse over 6 months..  The patient reports urinary frequency , urgency ,& nocturia X1. He  denies burning with urination, hematuria, and penile discharge.  The patient denies the following associated symptoms: nausea, flank pain, abdominal pain, back pain, and pelvic pain.  Risk factors for urinary tract infection include PMH of  diabetes.  The patient denies the following risk factors: history of GU anomaly.   He is concerned because he was on Actos for 2-3 years due to media reports of potential of bladder cancer. No ED symptoms. No  FH of G-U disease.       The patient denies polydipsia, blurred vision, weight loss, weight gain, and numbness of extremities.  The patient  also denies the following symptoms: neuropathic pain, chest pain, vomiting, orthostatic symptoms, poor wound healing, vision loss, and foot ulcer.  Since the last visit the patient reports fair  dietary compliance, exercising regularly ( walking 4X/week), and not monitoring blood glucose.  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 A1c was 5.8% in 07.2011; Actos was D/Ced then.  Current Medications (verified): 1)  Allopurinol 300 Mg  Tabs (Allopurinol) .... Take One Tablet Daily 2)  Freestyle Test   Strp (Glucose Blood) .... Test As Directed 3)  Unilet Excelite Ii   Misc (Lancets) .... Take  As Directed 4)  Celebrex 200 Mg  Caps (Celecoxib) .Marland Kitchen.. 1 By Mouth As Needed Only, **avoid Regular Use Due To Increase Heart and Stomach Risk** 5)  Viagra 100 Mg  Tabs (Sildenafil Citrate) .... 1/2-1 Once Daily Prn 6)  Cozaar 50 Mg  Tabs (Losartan Potassium) .Marland Kitchen.. 1 Qd 7)  Aspirin Adult Low Strength 81 Mg  Tbec (Aspirin) .Marland Kitchen.. 1 By Mouth Once Daily 8)  Simvastatin 40 Mg  Tabs (Simvastatin) .Marland Kitchen.. 1 Qhs  Allergies: 1)  ! * Lotrel 2)  Codeine  Past History:  Past Medical History: Diabetes mellitus, type II: A1c 6.3% in 08/2004; A1c 5.8% ( NON Diabetic) 05/2010 Gout Hyperlipidemia Hypertension R Lumbar  Radiculopathy; DDD of LS spine  Dr Harrell Lark  Physical Exam  General:  well-nourished;alert,appropriate and cooperative throughout examination Lungs:  Normal respiratory effort, chest expands symmetrically. Lungs are clear to auscultation, no crackles or wheezes. Heart:  Normal rate and regular rhythm. S1 and S2 normal without gallop, murmur, click, rub .S4 Abdomen:  Bowel sounds positive,abdomen soft and non-tender without masses, organomegaly or hernias noted. Rectal:  No external abnormalities noted. Normal sphincter tone. No rectal masses or tenderness. Genitalia:  Testes bilaterally descended without nodularity, tenderness or masses. No scrotal masses or lesions. No penis lesions or urethral discharge. L varicocele.   Prostate:  Broad & flat Pulses:  R and L carotid,radial,dorsalis pedis  and posterior tibial pulses are full and equal bilaterally Extremities:  No clubbing, cyanosis, edema. Minor nail fungal changes Neurologic:  alert & oriented X3 and sensation intact to light touch over feet .   Skin:  Dry skin over shins Psych:  memory intact for recent and remote, normally interactive, and good eye contact.     Impression & Recommendations:  Problem # 1:  URINARY FREQUENCY (ICD-788.41)  Orders: UA Dipstick W/ Micro (manual) ([address]109) Venipuncture (60454) TLB-PSA (Prostate  Specific Antigen) (84153-PSA) Prescription Created Electronically [phone])  His updated medication list for this problem includes:    Tamsulosin Hcl 0.4 Mg Caps (Tamsulosin hcl) .Marland Kitchen... 1 once daily  Problem # 2:  URINARY URGENCY (BJY-782.95)  Orders: Venipuncture (62130) TLB-PSA (Prostate Specific Antigen) (84153-PSA) Prescription Created Electronically [phone])  Problem # 3:  NOCTURIA (ION-629.52)  Orders: Venipuncture (84132) TLB-PSA (Prostate Specific Antigen) (84153-PSA) Prescription Created Electronically [phone])  Problem # 4:  HYPERGLYCEMIA, MILD (ICD-790.29)  Orders: Venipuncture (27253) TLB-A1C / Hgb A1C (Glycohemoglobin) (83036-A1C)  Complete Medication List: 1)  Allopurinol 300 Mg Tabs (Allopurinol) .... Take one tablet daily 2)  Freestyle Test Strp (Glucose blood) .... Test as directed 3)  Unilet Excelite Ii Misc (Lancets) .... Take as directed 4)  Celebrex 200 Mg Caps (Celecoxib) .Marland Kitchen.. 1 by mouth as needed only, **avoid regular use due to increase heart and stomach risk** 5)  Viagra 100 Mg Tabs (Sildenafil citrate) .... 1/2-1 once daily prn 6)  Cozaar 50 Mg Tabs (Losartan potassium) .Marland Kitchen.. 1 qd 7)  Aspirin Adult Low Strength 81 Mg Tbec (Aspirin) .Marland Kitchen.. 1 by mouth once daily 8)  Simvastatin 40 Mg Tabs (Simvastatin) .Marland Kitchen.. 1 qhs 9)  Tamsulosin Hcl 0.4 Mg Caps (Tamsulosin hcl) .Marland Kitchen.. 1 once daily  Patient Instructions: 1)  It is important that your Diabetic A1c level is checked every 6  months. 2)  See your eye doctor yearly to check for diabetic eye damage. 3)  Check your feet each night for sore areas, calluses or signs of infection. Prescriptions: TAMSULOSIN HCL 0.4 MG CAPS (TAMSULOSIN HCL) 1 once daily  #30 x 5   Entered and Authorized by:   Marga Melnick MD   Signed by:   Marga Melnick MD on 10/13/2010   Method used:   Faxed to ...       [facility] (retail)       [address]       [place], [place]  [address]       Ph: [phone]        Fax: [phone]   RxID:   [id number]    Orders Added: 1)  UA Dipstick W/ Micro (manual) [81000] 2)  Est. Patient Level IV [55732] 3)  Venipuncture [20254] 4)  TLB-PSA (Prostate Specific Antigen) [84153-PSA] 5)  TLB-A1C / Hgb A1C (Glycohemoglobin) [83036-A1C] 6)  Prescription Created Electronically [id number]     Laboratory Results   Urine Tests    Routine Urinalysis   Color: yellow Appearance: Clear Glucose: negative   (Normal Range: Negative) Bilirubin: negative   (Normal Range: Negative) Ketone: negative   (Normal Range: Negative) Spec. Gravity: 1.010   (Normal Range: 1.003-1.035) Blood: negative   (Normal Range: Negative) pH: 6.5   (Normal Range: 5.0-8.0) Protein: negative   (Normal Range: Negative) Urobilinogen: 0.2   (Normal Range: 0-1) Nitrite: negative   (Normal Range: Negative) Leukocyte Esterace: negative   (Normal Range: Negative)       Appended Document: frequent urination/cbs

## 2010-12-21 NOTE — Assessment & Plan Note (Signed)
Summary: follow up//ph   Vital Signs:  Patient profile:   70 year old male Weight:      201.4 pounds Pulse rate:   60 / minute Resp:     14 per minute BP sitting:   138 / 86  (left arm) Cuff size:   large  Vitals Entered By: Shonna Chock CMA (June 29, 2010 8:05 AM) CC: follow-up visit, Type 2 diabetes mellitus follow-up   CC:  follow-up visit and Type 2 diabetes mellitus follow-up.  History of Present Illness:  Type 2 Diabetes Mellitus Follow-Up      This is a 70 year old man who presents for Type 2 diabetes mellitus follow-up.  The patient reports purposeful weight loss of 5# over past year  and numbness of hands only positionally  @ night, but denies polyuria, polydipsia, blurred vision, and self managed hypoglycemia.  The patient denies the following symptoms: neuropathic pain, chest pain, vomiting, orthostatic symptoms, poor wound healing, intermittent claudication, vision loss, and foot ulcer.  Since the last visit the patient reports good dietary compliance.  The patient has been measuring capillary blood glucose before breakfast, range 90-100.  Since the last visit, the patient reports having had eye care by an Ophthalmologist; no retinopathy. A1c was 5.8 % off Actos for 6 months. Serial A1cs reviewed  ; A1c was 6.3% in 08/2004 but it has been controlled since. Maximal A1c after 2005 was 6% in 07/2007. CVE as walking 60 min 5X/week.   Current Medications (verified): 1)  Allopurinol 300 Mg  Tabs (Allopurinol) .... Take One Tablet Daily 2)  Freestyle Test   Strp (Glucose Blood) .... Test As Directed 3)  Unilet Excelite Ii   Misc (Lancets) .... Take As Directed 4)  Celebrex 200 Mg  Caps (Celecoxib) .Marland Kitchen.. 1 By Mouth As Needed Only, **avoid Regular Use Due To Increase Heart and Stomach Risk** 5)  Viagra 100 Mg  Tabs (Sildenafil Citrate) .... 1/2-1 Once Daily Prn 6)  Cozaar 50 Mg  Tabs (Losartan Potassium) .Marland Kitchen.. 1 Qd 7)  Aspirin Adult Low Strength 81 Mg  Tbec (Aspirin) .Marland Kitchen.. 1 By Mouth  Once Daily 8)  Simvastatin 40 Mg  Tabs (Simvastatin) .Marland Kitchen.. 1 Qhs  Allergies: 1)  ! * Lotrel 2)  Codeine  Past History:  Past Medical History: Diabetes mellitus, type II: A1c 6.3% in 08/2004 Gout Hyperlipidemia Hypertension R Lumbar  Radiculopathy; DDD of LS spine  Dr Harrell Lark  Physical Exam  General:  well-nourished; alert,appropriate and cooperative throughout examination Lungs:  Normal respiratory effort, chest expands symmetrically. Lungs are clear to auscultation, no crackles or wheezes. Heart:  Normal rate and regular rhythm. S1 and S2 normal without gallop, murmur, click, rub or other extra sounds. Pulses:  R and L carotid,radial,dorsalis pedis and posterior tibial pulses are full and equal bilaterally Extremities:  No clubbing, cyanosis, edema.Chronic great toe nail changes  Neurologic:  alert & oriented X3 and sensation intact to light touch over feet.   Skin:  Intact without suspicious lesions or rashes   Impression & Recommendations:  Problem # 1:  DIABETES MELLITUS, TYPE II, CONTROLLED (ICD-250.00) Resolved with TLC ( nutrition & exercise) w/o meds The following medications were removed from the medication list:    Actos 30 Mg Tabs (Pioglitazone hcl) .Marland Kitchen... Take one tablet daily His updated medication list for this problem includes:    Cozaar 50 Mg Tabs (Losartan potassium) .Marland Kitchen... 1 qd    Aspirin Adult Low Strength 81 Mg Tbec (Aspirin) .Marland Kitchen... 1 by  mouth once daily  Complete Medication List: 1)  Allopurinol 300 Mg Tabs (Allopurinol) .... Take one tablet daily 2)  Freestyle Test Strp (Glucose blood) .... Test as directed 3)  Unilet Excelite Ii Misc (Lancets) .... Take as directed 4)  Celebrex 200 Mg Caps (Celecoxib) .Marland Kitchen.. 1 by mouth as needed only, **avoid regular use due to increase heart and stomach risk** 5)  Viagra 100 Mg Tabs (Sildenafil citrate) .... 1/2-1 once daily prn 6)  Cozaar 50 Mg Tabs (Losartan potassium) .Marland Kitchen.. 1 qd 7)  Aspirin Adult Low Strength 81 Mg  Tbec (Aspirin) .Marland Kitchen.. 1 by mouth once daily 8)  Simvastatin 40 Mg Tabs (Simvastatin) .Marland Kitchen.. 1 qhs  Patient Instructions: 1)  Monitor lipids , A1c & uric acid  annually. Consume < 40 grams of High Fructose Corn Syrup sugar/ day as discussed.

## 2010-12-21 NOTE — Progress Notes (Signed)
Summary: refill  Phone Note Refill Request Message from:  Fax from Pharmacy on June 30, 2010 4:52 PM  Refills Requested: Medication #1:  CELEBREX 200 MG  CAPS 1 by mouth as needed ONLY [facility] - fax [phone]  Initial call taken by: Okey Regal [place],  June 30, 2010 4:53 PM    Prescriptions: CELEBREX 200 MG  CAPS (CELECOXIB) 1 by mouth as needed ONLY, **AVOID REGULAR USE DUE TO INCREASE HEART AND STOMACH RISK**  #60 x 1   Entered by:   Shonna Chock CMA   Authorized by:   Marga Melnick MD   Signed by:   Shonna Chock CMA on 07/01/2010   Method used:   Electronically to        [facility] (retail)       [address]       [place], [place]  [id number]       Ph: [phone]       Fax: [phone]   RxID:   [id number]

## 2010-12-21 NOTE — Progress Notes (Signed)
Summary: Refill Request  Phone Note Refill Request Call back at Home Phone [phone] Message from:  Pharmacy on [facility] Fax #: [phone]  Refills Requested: Medication #1:  ACTOS 30 MG  TABS take one tablet daily   Dosage confirmed as above?Dosage Confirmed   Supply Requested: 1 month   Last Refilled: 12/17/2009 Initial call taken by: Harold Barban,  January 15, 2010 3:51 PM  Follow-up for Phone Call        pt is currently in [place] and will not return back to [facility] until middle of May.per last labs A1c is well within NON Diabetes range. Please see me with all glucose recordings before refilling Actos. do you want to continue filling med until then.  Pt has a 5 day supply left of med.  pls advise...........Marland KitchenFelecia Deloach CMA  January 15, 2010 5:05 PM   Additional Follow-up for Phone Call Additional follow up Details #1::        Stop Actos; recheck fasting lipids, hepatic panel, A1c, BUN,creat , K+ 3-4 months off Actos (478.29,562.13) Additional Follow-up by: Marga Melnick MD,  January 18, 2010 6:21 PM    Additional Follow-up for Phone Call Additional follow up Details #2::    pt aware...............Marland KitchenFelecia Deloach CMA  January 19, 2010 8:41 AM

## 2010-12-21 NOTE — Progress Notes (Signed)
Summary: ALLOPURINOL REFILL  Phone Note Refill Request Message from:  Fax from Pharmacy on September 13, 2010 11:19 AM  Refills Requested: Medication #1:  ALLOPURINOL 300 MG  TABS take one tablet daily [facility], [address] [phone]  Initial call taken by: Jerolyn Shin,  September 13, 2010 11:31 AM    Prescriptions: ALLOPURINOL 300 MG  TABS (ALLOPURINOL) take one tablet daily  #90 x 2   Entered by:   Shonna Chock CMA   Authorized by:   Marga Melnick MD   Signed by:   Shonna Chock CMA on 09/13/2010   Method used:   Electronically to        [facility] (retail)       [address]       [place], [place]  [id number]       Ph: [phone]       Fax: [phone]   RxID:   [id number]

## 2010-12-21 NOTE — Progress Notes (Signed)
Summary: FYI: Pharmacy Change  Phone Note Call from Patient   Caller: Patient Summary of Call: PT WANTS TO LET DR.HOPPER KNOW THAT HE WILL BE CHANGING PHARMACYS TO  [facility] [address] [facility] Initial call taken by: Lavell Islam,  October 21, 2010 10:47 AM  Follow-up for Phone Call        Noted, changed on pharmacy contact list Follow-up by: Shonna Chock CMA,  October 21, 2010 2:09 PM

## 2010-12-21 NOTE — Letter (Signed)
Summary: Results Follow up Letter  [facility]  [address] [place], [place] [address]   Phone: [phone]  Fax: [phone]    06/08/2009 MRN: [id number]  Carl Wilcox [address] [place], [place]  [address]  Dear [name],  The following are the results of your recent test(s):  Test         Result    Pap Smear:        Normal _____  Not Normal _____ Comments: ______________________________________________________ Cholesterol: LDL(Bad cholesterol):         Your goal is less than:         HDL (Good cholesterol):       Your goal is more than: Comments:  ______________________________________________________ Mammogram:        Normal _____  Not Normal _____ Comments:  ___________________________________________________________________ Hemoccult:        Normal _____  Not normal _______ Comments:    _____________________________________________________________________ Other Tests: PLEASE SEE ATTACHED LABS DONE ON 06/01/2009    We routinely do not discuss normal results over the telephone.  If you desire a copy of the results, or you have any questions about this information we can discuss them at your next office visit.   Sincerely,

## 2010-12-23 NOTE — Progress Notes (Signed)
Summary: Refill Request  Phone Note Refill Request Call back at [phone] Message from:  Pharmacy on December 02, 2010 1:17 PM  Refills Requested: Medication #1:  SIMVASTATIN 40 MG  TABS 1 qhs   Dosage confirmed as above?Dosage Confirmed   Supply Requested: 30   Last Refilled: 11/03/2010 [facility] [address] in [place], [place]  Next Appointment Scheduled: none Initial call taken by: Harold Barban,  December 02, 2010 1:24 PM    Prescriptions: SIMVASTATIN 40 MG  TABS (SIMVASTATIN) 1 qhs  #30 x 0   Entered by:   Shonna Chock CMA   Authorized by:   Marga Melnick MD   Signed by:   Shonna Chock CMA on 12/02/2010   Method used:   Print then Give to Patient   RxID:   [id number]

## 2011-01-04 ENCOUNTER — Telehealth (INDEPENDENT_AMBULATORY_CARE_PROVIDER_SITE_OTHER): Payer: Self-pay | Admitting: *Deleted

## 2011-01-12 NOTE — Progress Notes (Addendum)
Summary: [place]Simvastatin  Phone Note Refill Request Message from:  Fax from Pharmacy on January 04, 2011 12:07 PM  Refills Requested: Medication #1:  SIMVASTATIN 40 MG  TABS 1 qhs   Supply Requested: 30   Last Refilled: 12/02/2010 [facility] [place] [phone] fax  [phone]  Next Appointment Scheduled: NONE Initial call taken by: Lucious Groves CMA,  January 04, 2011 12:07 PM  Follow-up for Phone Call        RX called in with the indication patient due for labs.Shonna Chock CMA  January 04, 2011 2:09 PM     Prescriptions: SIMVASTATIN 40 MG  TABS (SIMVASTATIN) 1 qhs  #30 x 0   Entered by:   Shonna Chock CMA   Authorized by:   Marga Melnick MD   Signed by:   Shonna Chock CMA on 01/04/2011   Method used:   Historical   RxID:   [id number]   Appended Document: Out of state refill--Simvastatin Patient states that the pharmacy didn't receive the RX for Simvastatin that was sent over on 01/04/11. Patient also wants a 90 day supply. I spoke with Tresa Endo and she advised that the reason he will only get a 30 day supply is because he is due for labs which was indicated when the RX was faxed. I advised that patient that he will only receive a 30 day supply until he comes for labs. Patient states that he just had his lab done in December and disagrees with this. Please advise. Pt can be contacted at [phone] .Marland KitchenKalyn Negrete  January 11, 2011 9:01 AM    I can re-send the prescription, and patient last labs were in Nov 2011. Please advise of qty. Thanks. Lucious Groves CMA  January 11, 2011 9:14 AM   Rx printed and faxed to [phone]. Per MD: Copied from append comment box--> "#90 ,but his last lipids & liver panel were in 10/2009. These must be done before next refill as per standard of care to  verify med is working w/o risk to him. In 10/2010 only PSA & A1c were done"  Left message on voicemail to call back to office.  Lucious Groves CMA  January 11, 2011 2:33 PM   Patient notified. Lucious Groves CMA  January 12, 2011 2:07 PM    Prescriptions: SIMVASTATIN 40 MG  TABS (SIMVASTATIN) 1 qhs  #90 x 0   Entered by:   Lucious Groves CMA   Authorized by:   Marga Melnick MD   Signed by:   Lucious Groves CMA on 01/11/2011   Method used:   Printed then faxed to ...       [facility] [address] [phone]* (retail)       [address]       [place], [place]  [address]       Ph: [phone] or [phone]       Fax: [phone]   RxID:   [id number]

## 2011-03-30 ENCOUNTER — Other Ambulatory Visit: Payer: Self-pay

## 2011-03-30 MED ORDER — CELECOXIB 200 MG PO CAPS: 200.0000 mg | ORAL_CAPSULE | Freq: Every day | ORAL | Status: DC | PRN

## 2011-04-05 NOTE — Op Note (Signed)
Carl Wilcox, Carl Wilcox NO.:  [id number]   MEDICAL RECORD NO.:  [id number]          PATIENT TYPE:  INP   LOCATION:  1601                         FACILITY:  [facility]   PHYSICIAN:  Carl Wilcox. Carl Wilcox, M.D.  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   FINDINGS:  The patient was noted to have a stable femoral stem.   PROCEDURE:  1. Revision of right total hip with exchange of his acetabular liner      to a new Crossfire cross-link polyethylene liner for acetabular      shell with a 32 mm inner diameter and a 56 outer diameter with a 10      degrees high wall and a 32 Delta ceramic ball with a +5 sleeve      placed over the old trunnion.  2. He had an open reduction and internal fixation of the right      periprosthetic fracture utilizing four Domino Vitallium ball-tipped      cables.   SURGEON:  Carl Wilcox. Carl Wilcox, M.D.   ASSISTANTS:  Carl Wilcox, M.D. and Carl Wilcox. Mann, PA.   ANESTHESIA:  General.   BLOOD LOSS:  250 mL.   COMPLICATIONS:  None.   FINDINGS:  No signs of infection.   SPECIMENS:  None.   INDICATIONS FOR PROCEDURE:  Mr. Carl Wilcox is a pleasant 70 year old gentleman  who presented to the emergency room on December 20 after falling on ice  and landing directly on his right hip.  Radiographs revealed a spiral  fracture through the prostatic area of the proximal femur.  The femoral  stem did not have any evidence of subsidence and appeared to be stable.  He was initially admitted by one of my partners and one of his friends,  Dr. Hayden Wilcox.  I had a lengthy discussion with Mr. Carl Wilcox about the  options available.  At this point, I felt that based on the appearance  of the stem with no evidence of obvious subsidence or failure that the  best chance for longevity would be attempt at repairing his fracture  site with cables to tightly secure it and allow his body to heal around  his prosthesis.  However, given the fact that he had some evidence of  asymmetric polyethylene wear I suggested since we were going to be  operating on the hip to go ahead to go ahead and revise him to a ceramic  ball and new liner that would potentially aid in the longevity of his  hip.  After reviewing the risks and benefits of this, including the  risks that his femoral stem may be microscopically loose and may need to  be revised at  some point, but we would at least have this fracture  healing in anatomic position.  In addition to standard risks including  infection, dislocation. component failure, need for revision surgery for  any reason consent was obtained for the above procedure.   PROCEDURE IN DETAIL:  The patient was brought to the operative theater.  Once adequate anesthesia and 2 grams of Ancef administered the patient  was positioned in the left lateral decubitus position with right-side  up.  Careful movement of the right extremity was carried out throughout  the pre-scrub and prepping and draping.  I identified the patient and  extremity in a time-out.  We then pre-scrubbed, prepped and draped the  right lateral hip.   The patient's old incision was used and extended further distal.  Sharp  dissection was carried down to the iliotibial band and gluteal fascia.  I then opened up the iliotibial band distally and the gluteal fascia  posteriorly.  I then elevated the posterior vastus lateralis off of the  intermuscular septum exposing the fracture site in the femur.  With  retractors placed and the fracture site identified I used a  Jackson  clamp to reduce the distal portion of the fracture anatomically.  I then  placed a series of cables over this fracture site, three cables to be  exact distal to the lesser trochanter.  The fracture was reduced in an  anatomic fashion, with this tightened and tensioned appropriately.   At this point, I exposed the hip.  Following careful scar dissection and  removing the pseudocapsule from the posterior aspect of the hip and  preserving the leaflet for later anatomic repair, as well as to protect  the sciatic nerve.  I was able to dislocate the hip and remove the  femoral head.  I then examined the femoral stem and found it to be  stable.  There was no macro motion of the femoral stem with me moving  it.  I did place a fourth cable around the lesser trochanter, around the  proximal femur further supporting the construct.   At this point, I placed the trunnion onto the superior ilium and then  exposed the cup further.  Once I had the cup exposed I used 6.5 mm  cancellous screw to remove the liner.  I debrided the rim of the cup so  I could see it, irrigated it and then placed and new 56, 10 degrees high  wall liner for a 32 mm ball.  This was then impacted and identified to  be sitting along the rim without complication.   We then fit based on the fact that I removed a 26, +5 ball.  A 5 mm, + 5  mm sleeve and then a 32 Delta ceramic ball onto a clean and dry  trunnion.  The hip was reduced.  There was no evidence of instability  even with the retractor placement and no signs of any complication to  the femur.   We irrigated the hip throughout the case and again at this point.  I  reapproximated the posterior capsular tissue to itself using #1 Vicryl.  I placed a medium Hemovac drain deep.  I then reapproximated the  iliotibial band over the vastus lateralis musculature, over the fracture  site using #1 Vicryl in a running fashion.  This was continued into the  posterior aspect  along the gluteal fascia.  The remainder of the wound  was closed with 2-0 Vicryl and staples on  the skin.  The skin was  cleaned, dried and dressed sterilely with a Mepilex dressing.  He was  brought to the recovery room in stable condition tolerating the  procedure well.      Carl Wilcox Carl Wilcox, M.D.  Electronically Signed     MDO/MEDQ  D:  11/11/2008  T:  11/11/2008  Job:  [id number]

## 2011-04-05 NOTE — H&P (Signed)
NAME:  Carl Wilcox, Carl Wilcox NO.:  [id number]   MEDICAL RECORD NO.:  [id number]          PATIENT TYPE:  EMS   LOCATION:  MAJO                         FACILITY:  [facility]   PHYSICIAN:  Erasmo Leventhal, M.D.DATE OF BIRTH:  1941-04-29   DATE OF ADMISSION:  11/09/2008  DATE OF DISCHARGE:                              HISTORY & PHYSICAL   TIME:  11:50 a.m.   PRIMARY CARE PHYSICIAN:  Titus Dubin. Alwyn Ren, MD, FACP, FCCP   HISTORY OF PRESENT ILLNESS:  Carl Wilcox is a 70 year old Caucasian male  who went to get the paper today and slips on the ice.  He developed  immediate onset of right thigh pain, could not ambulate, brought himself  to the house and brought to the emergency room by EMS.  He was seen and  evaluated by Dr. Effie Shy, found to have a right periprosthetic femoral  fracture.  The patient had undergone right hip surgery by Dr. Eulah Pont  several years ago for total hip arthroplasty.  However, the patient has  requested myself and __________ at this time.  The patient only  complains of right thigh pain.  No head injury.  No neck pain.   ALLERGIES:  None.   CURRENT MEDICATIONS:  Actos, aspirin, Celebrex, simvastatin, Cozaar, and  allopurinol.   MEDICAL HISTORY:  1. Type 2 adult-onset diabetes mellitus.  2. Gout.  3. Hypertension.  4. Hypercholesterolemia.   FAMILY HISTORY:  Noncontributory.   SOCIAL HISTORY:  Married.  No tobacco.  Positive ethanol, but only  social.   REVIEW OF SYSTEMS:  Unremarkable.   PHYSICAL EXAMINATION:  VITAL SIGNS:  Temperature 97.7, pulse 80,  respirations 18, and blood pressure 147/80.  GENERAL:  Well-developed, well-nourished male in no acute distress.  HEENT:  Normocephalic and atraumatic.  Conjunctivae moist and pink.  Sclerae anicteric.  Pupils equal, round, reactive to light and  accommodation.  Mouth and throat, unremarkable.  NECK:  Supple.  Full range of motion without thyromegaly, JVDs, or  bruits.  CHEST:  Normal  excursion.  LUNGS:  Clear to auscultation.  HEART:  Regular rate and rhythm without murmurs, gallops, or rubs.  ABDOMEN:  Flat.  Normal bowel sounds.  Nontender.  GENITOURINARY:  Deferred.  RECTAL:  Deferred.  EXTREMITIES:  Bilateral upper extremities unremarkable, left lower  extremity unremarkable.  Right lower extremity, normal rotation in  length.  Foot; ankle; and knee, unremarkable.  Thigh; tender, minimal  swelling.  SKIN:  Intact.  NEUROLOGIC:  Distal neurovascular examination  is intact to both  dorsiflexion and plantar flexion with light touch sensation.  Pulse is  4+ and symmetric.   X-ray reveals a right periprosthetic femoral fracture.   Labs are pending at this time.   IMPRESSION:  Right periprosthetic femoral fracture.   RECOMMENDATION:  EKG, chest x-ray, labs review, admit to the hospital.  I have discussed this with Dr. Cornelius Moras.  He would perform surgery tomorrow.  The patient to be made n.p.o. after midnight.  I have discussed risks  and benefits to the patient in detail and will meet Dr. Cornelius Moras tomorrow.  All  questions encouraged and answered the patient and his wife.           ______________________________  Erasmo Leventhal, M.D.     RAC/MEDQ  D:  11/09/2008  T:  11/10/2008  Job:  [id number]

## 2011-04-08 ENCOUNTER — Other Ambulatory Visit: Payer: Self-pay

## 2011-04-08 MED ORDER — LOSARTAN POTASSIUM 50 MG PO TABS: 50.0000 mg | ORAL_TABLET | Freq: Every day | ORAL | Status: DC

## 2011-04-08 NOTE — Discharge Summary (Signed)
NAMEGENESIS, PAGET NO.:  [id number]   MEDICAL RECORD NO.:  [id number]          PATIENT TYPE:  INP   LOCATION:  1601                         FACILITY:  [facility]   PHYSICIAN:  Madlyn Frankel. Charlann Boxer, M.D.  DATE OF BIRTH:  [birth year]   DATE OF ADMISSION:  11/09/2008  DATE OF DISCHARGE:  11/17/2008                               DISCHARGE SUMMARY   ADMITTING DIAGNOSES:  1. Osteoarthritis.  2. Diabetes.  3. Gout.  4. Hypertension.  5. Hypercholesteremia.   DISCHARGE DIAGNOSES:  1. Osteoarthritis.  2. Diabetes.  3. Gout.  4. Hypertension.  5. Hypercholesteremia.   PROCEDURE:  Open reduction internal fixation of a right femur fracture,  periprosthetic and revision of right total hip replacement by surgeon  Dr. Durene Romans, assistant Dr. Hayden Rasmussen and Dwyane Luo, PA-C.   CONSULTANTS:  None.   LABORATORY DATA:  CBC final reading hemoglobin 10.2, hematocrit 30.8,  platelets 121.  Metabolic panel:  Sodium 138, potassium 3.6, glucose  123, creatinine 0.95.  Calcium was 8.5 with GFR greater than 60.   CARDIOLOGY:  EKG shows sinus bradycardia, right bundle branch block.   RADIOLOGY:  AP pelvis in the emergency department showed oblique  periprosthetic fracture of the right femur.  Also eccentric cup wear of  his right total hip replacement.   Portable pelvis postoperatively showed reduction of the fracture and  revision of the right femoral component with radiographic evidence for  complication.   HISTORY OF PRESENT ILLNESS AND HOSPITAL COURSE:  Mr. Dunne is a 70-year-  old Caucasian male who slipped on some ice in his driveway while going  to get the paper.  He was brought to the emergency department.  X-rays  revealed that he had a periprosthetic femur fracture.  Dr. Thomasena Edis saw  the patient and admitted the patient to our service.  Transferred care  to Dr. Charlann Boxer.  He was stabilized on the orthopedic floor and surgery was  performed on November 10, 2008.  Afterwards  he had slow progress with  some pain issues.  Otherwise, hemodynamically he remained stable.  Dressing was changed with no significant drainage from the wound at any  time.  His stay was mainly limited by his pain as well as inability to  ambulate without significant pain.  We adjusted his pain medicines.  He  was going to go to a nursing facility rehab, however he improved after  day #4.  Due to the holidays and the weekend and his improving progress  he was kept an additional day to meet criteria for discharge home on  November 17, 2008.  By November 17, 2008 he was able to ambulate at  least 75 feet, able to go from sitting to standing with moderate  assistance with pain well-controlled.   DISCHARGE DISPOSITION:  Discharged home with home health care PT stable  and improved condition.   DISCHARGE PHYSICAL THERAPY:  He is partial weightbearing 50% on his  right lower extremity.   DISCHARGE DIET:  Heart-healthy.   DISCHARGE WOUND CARE:  Keep dry.   DISCHARGE MEDICATIONS:  1. Robaxin  500 mg 1 p.o. q.6 h. p.r.n. muscle spasm.  2. Oxycodone 5 mg 1-3 p.o. q.3-4 h. for pain.  3. Iron 325 mg p.o. t.i.d. x2 weeks.  4. Colace 100 mg 1 p.o. b.i.d.  5. MiraLax 17 grams p.o. daily.  6. Aspirin 325 mg p.o. b.i.d. x4 weeks and then daily x2 weeks and      then return back to 81 mg a day.  7. Actos 30 mg p.o. daily.  8. Allopurinol 300 mg p.o. daily.  9. Celebrex 200 mg p.o. daily.  10.Simvastatin 40 mg p.o. daily.  11.Cozaar 50 mg p.o. daily.   DISCHARGE FOLLOWUP:  Follow up with Dr. Charlann Boxer at phone number [phone] in  2 weeks.     ______________________________  Yetta Glassman [address], [place]      Madlyn Frankel. Charlann Boxer, M.D.  Electronically Signed    BLM/MEDQ  D:  12/09/2008  T:  12/09/2008  Job:  [id number]   cc:   Titus Dubin. Alwyn Ren, MD,FACP,FCCP  [phone]  [place] [address]

## 2011-05-05 ENCOUNTER — Other Ambulatory Visit: Payer: Self-pay | Admitting: Internal Medicine

## 2011-05-10 ENCOUNTER — Other Ambulatory Visit: Payer: Self-pay

## 2011-05-10 MED ORDER — SIMVASTATIN 40 MG PO TABS: 40.0000 mg | ORAL_TABLET | Freq: Every day | ORAL | Status: DC

## 2011-06-21 ENCOUNTER — Other Ambulatory Visit: Payer: Self-pay | Admitting: Internal Medicine

## 2011-06-21 MED ORDER — ALLOPURINOL 300 MG PO TABS: 300.0000 mg | ORAL_TABLET | Freq: Every day | ORAL | Status: DC

## 2011-06-21 NOTE — Telephone Encounter (Signed)
RX sent to pharmacy  

## 2011-07-05 ENCOUNTER — Ambulatory Visit (INDEPENDENT_AMBULATORY_CARE_PROVIDER_SITE_OTHER): Payer: Medicare Other | Admitting: Internal Medicine

## 2011-07-05 ENCOUNTER — Encounter: Payer: Self-pay | Admitting: Internal Medicine

## 2011-07-05 VITALS — BP 124/80 | HR 58 | Temp 97.7°F | Resp 12 | Ht 68.5 in | Wt 213.8 lb

## 2011-07-05 DIAGNOSIS — Z23 Encounter for immunization: Secondary | ICD-10-CM

## 2011-07-05 DIAGNOSIS — E119 Type 2 diabetes mellitus without complications: Secondary | ICD-10-CM

## 2011-07-05 DIAGNOSIS — Z0000 Encounter for general adult medical examination without abnormal findings: Secondary | ICD-10-CM

## 2011-07-05 DIAGNOSIS — M109 Gout, unspecified: Secondary | ICD-10-CM

## 2011-07-05 DIAGNOSIS — E785 Hyperlipidemia, unspecified: Secondary | ICD-10-CM

## 2011-07-05 DIAGNOSIS — I10 Essential (primary) hypertension: Secondary | ICD-10-CM

## 2011-07-05 DIAGNOSIS — R7309 Other abnormal glucose: Secondary | ICD-10-CM

## 2011-07-05 LAB — MICROALBUMIN / CREATININE URINE RATIO
Creatinine,U: 22.4 mg/dL
Microalb Creat Ratio: 0.9 mg/g (ref 0.0–30.0)
Microalb, Ur: 0.2 mg/dL (ref 0.0–1.9)

## 2011-07-05 LAB — BASIC METABOLIC PANEL
CO2: 30 mEq/L (ref 19–32)
Calcium: 9.3 mg/dL (ref 8.4–10.5)
Creatinine, Ser: 1 mg/dL (ref 0.4–1.5)
GFR: 79.6 mL/min (ref >60.00)
Glucose, Bld: 87 mg/dL (ref 70–99)
Sodium: 141 mEq/L (ref 135–145)

## 2011-07-05 LAB — HEPATIC FUNCTION PANEL
ALT: 18 U/L (ref 0–53)
AST: 23 U/L (ref 0–37)
Albumin: 4.2 g/dL (ref 3.5–5.2)
Alkaline Phosphatase: 86 U/L (ref 39–117)
Bilirubin, Direct: 0.2 mg/dL (ref 0.0–0.3)
Total Bilirubin: 0.7 mg/dL (ref 0.3–1.2)
Total Protein: 7.1 g/dL (ref 6.0–8.3)

## 2011-07-05 LAB — TSH: TSH: 2.26 u[IU]/mL (ref 0.35–5.50)

## 2011-07-05 LAB — URIC ACID: Uric Acid, Serum: 5.2 mg/dL (ref 4.0–7.8)

## 2011-07-05 LAB — LIPID PANEL
Cholesterol: 137 mg/dL (ref 0–200)
HDL: 42.3 mg/dL
LDL Cholesterol: 70 mg/dL (ref 0–99)
Total CHOL/HDL Ratio: 3
Triglycerides: 123 mg/dL (ref 0.0–149.0)
VLDL: 24.6 mg/dL (ref 0.0–40.0)

## 2011-07-05 LAB — HEMOGLOBIN A1C: Hgb A1c MFr Bld: 5.9 % (ref 4.6–6.5)

## 2011-07-05 MED ORDER — TETANUS-DIPHTH-ACELL PERTUSSIS 5-2.5-18.5 LF-MCG/0.5 IM SUSP
0.5000 mL | Freq: Once | INTRAMUSCULAR | Status: AC
  Administered 2011-07-05: 0.5 mL via INTRAMUSCULAR

## 2011-07-05 NOTE — Patient Instructions (Signed)
Preventive Health Care: Exercise at least 30-45 minutes a day,  3-4 days a week.  Eat a low-fat diet with lots of fruits and vegetables, up to 7-9 servings per day. Consume less than 40 grams of sugar per day from foods & drinks with High Fructose Corn Sugar as # 1,2,3 or # 4 on label.  

## 2011-07-05 NOTE — Progress Notes (Signed)
Subjective:    Patient ID: Carl Wilcox, male    DOB: Jan 12, 1941, 70 y.o.   MRN: [id number]  HPI Medicare Wellness Visit:  The following psychosocial & medical history were reviewed as required by Medicare.   Social history: caffeine: none , alcohol:  2/ week ,  tobacco use : never  & exercise : walking 3-4 X/ week for 35-40 min.   Home & personal  safety / fall risk: no issues, activities of daily living: no limitations , seatbelt use : yes , and smoke alarm employment : yes .  Power of Attorney/Living Will status : in place  Vision ( as recorded per Nurse) & Hearing  evaluation : last Ophth exam 6 mos ago; macular hole diagnosed wall chart read @ 6 ft with lenses.Whisper heard @ 6 ft. Orientation :oriented X3 , memory & recall :good, spelling  testing: good,and mood & affect : normal . Depression / anxiety: denied Travel history : [place] 07/12 , immunization status :Pneumovax needed , transfusion history:  no, and preventive health surveillance ( colonoscopies, BMD , etc as per protocol/ [facility]): colonoscopy 2011, Dental care:  Seen every 6 months . Chart reviewed &  Updated. Active issues reviewed & addressed.    Review of Systems#1 HYPERTENSION: Disease Monitoring: Blood pressure range-120/80 usually @ home  Chest pain, palpitations- no       Dyspnea- no Medications: Compliance- yes  Lightheadedness,Syncope- no    Edema- no  #2 Fasting Hyperglycemia ; PMH of DIABETES: Disease Monitoring: Blood Sugar ranges-not checked  Polyuria/phagia/dipsia- no       Visual problems- see above Medications: Compliance- no meds  Hypoglycemic symptoms- no  #3 HYPERLIPIDEMIA: Disease Monitoring: See symptoms for Hypertension Medications: Compliance- yes  Abd pain, bowel changes- no   Muscle aches- no    ROS See HPI above         Objective:   Physical Exam  Gen.: Healthy and well-nourished in appearance. Alert, appropriate and cooperative throughout exam. Head: Normocephalic without  obvious abnormalities;  no alopecia  Eyes: No corneal or conjunctival inflammation noted. Pupils equal round reactive to light and accommodation. Fundal exam is benign without hemorrhages, exudate, papilledema.  Ears: External  ear exam reveals no significant lesions or deformities. Canals clear .TMs normal.  Nose: External nasal exam reveals no deformity or inflammation. Nasal mucosa are pink and moist. No lesions or exudates noted. Septum  normal Mouth: Oral mucosa and oropharynx reveal no lesions or exudates. Teeth in good repair. Neck: No deformities, masses, or tenderness noted. Range of motion & . Thyroid normal. Lungs: Normal respiratory effort; chest expands symmetrically. Lungs are clear to auscultation without rales, wheezes, or increased work of breathing. Heart: Normal rate and rhythm. Normal S1 and S2. No gallop, click, or rub. No murmur. Abdomen: Bowel sounds normal; abdomen soft and nontender. No masses, organomegaly or hernias noted. Genitalia/DRE: Normal except for small internal hemorrhoid    .                                                                                   Musculoskeletal/extremities: No deformity or scoliosis noted of  the thoracic or lumbar spine. No clubbing, cyanosis,  edema, or deformity noted. Range of motion  normal .Tone & strength  Normal.Joints: L index finger DIP OA change.Nail health  good. Vascular: Carotid, radial artery, dorsalis pedis and  posterior tibial pulses are full and equal. No bruits present. Neurologic: Alert and oriented x3. Deep tendon reflexes symmetrical and normal.          Skin: Intact without suspicious lesions or rashes. Lymph: No cervical, axillary, or inguinal lymphadenopathy present. Psych: Mood and affect are normal. Normally interactive                                                                                        Assessment & Plan:  #1 Medicare Wellness Exam; criteria met ; data entered #2 Problem List  reviewed ; Assessment/ Recommendations made. He has a past medical history of diabetes, his A1c has been less than 6 at least since 2009.  #3 right bundle branch block, new since 2007. No ischemic changes present.  Plan: see Orders

## 2011-07-08 ENCOUNTER — Other Ambulatory Visit: Payer: Self-pay | Admitting: Internal Medicine

## 2011-08-06 ENCOUNTER — Other Ambulatory Visit: Payer: Self-pay | Admitting: Internal Medicine

## 2011-08-25 LAB — CROSSMATCH: Antibody Screen: NEGATIVE

## 2011-08-25 LAB — BASIC METABOLIC PANEL
BUN: 13 mg/dL (ref 6–23)
Calcium: 8.4 mg/dL (ref 8.4–10.5)
Chloride: 101 mEq/L (ref 96–112)
Creatinine, Ser: 0.77 mg/dL (ref 0.4–1.5)
GFR calc Af Amer: >60 mL/min (ref >60)
GFR calc Af Amer: >60 mL/min (ref >60)
GFR calc non Af Amer: >60 mL/min (ref >60)
GFR calc non Af Amer: >60 mL/min (ref >60)
Potassium: 3.6 mEq/L (ref 3.5–5.1)
Sodium: 137 mEq/L (ref 135–145)
Sodium: 138 mEq/L (ref 135–145)

## 2011-08-25 LAB — GLUCOSE, CAPILLARY
Glucose-Capillary: 101 mg/dL — ABNORMAL HIGH (ref 70–99)
Glucose-Capillary: 107 mg/dL — ABNORMAL HIGH (ref 70–99)
Glucose-Capillary: 109 mg/dL — ABNORMAL HIGH (ref 70–99)
Glucose-Capillary: 110 mg/dL — ABNORMAL HIGH (ref 70–99)
Glucose-Capillary: 112 mg/dL — ABNORMAL HIGH (ref 70–99)
Glucose-Capillary: 113 mg/dL — ABNORMAL HIGH (ref 70–99)
Glucose-Capillary: 117 mg/dL — ABNORMAL HIGH (ref 70–99)
Glucose-Capillary: 118 mg/dL — ABNORMAL HIGH (ref 70–99)
Glucose-Capillary: 119 mg/dL — ABNORMAL HIGH (ref 70–99)
Glucose-Capillary: 119 mg/dL — ABNORMAL HIGH (ref 70–99)
Glucose-Capillary: 122 mg/dL — ABNORMAL HIGH (ref 70–99)
Glucose-Capillary: 124 mg/dL — ABNORMAL HIGH (ref 70–99)
Glucose-Capillary: 137 mg/dL — ABNORMAL HIGH (ref 70–99)
Glucose-Capillary: 91 mg/dL (ref 70–99)

## 2011-08-25 LAB — CBC
HCT: 33.8 % — ABNORMAL LOW (ref 39.0–52.0)
Hemoglobin: 11.5 g/dL — ABNORMAL LOW (ref 13.0–17.0)
Hemoglobin: 11.8 g/dL — ABNORMAL LOW (ref 13.0–17.0)
MCV: 96.1 fL (ref 78.0–100.0)
Platelets: 108 10*3/uL — ABNORMAL LOW (ref 150–400)
Platelets: 121 10*3/uL — ABNORMAL LOW (ref 150–400)
RBC: 3.54 MIL/uL — ABNORMAL LOW (ref 4.22–5.81)
RBC: 3.65 MIL/uL — ABNORMAL LOW (ref 4.22–5.81)
WBC: 5.8 10*3/uL (ref 4.0–10.5)
WBC: 5.8 10*3/uL (ref 4.0–10.5)
WBC: 6.3 10*3/uL (ref 4.0–10.5)

## 2011-08-25 LAB — ABO/RH: ABO/RH(D): A POS

## 2011-08-26 LAB — DIFFERENTIAL
Basophils Relative: 1 % (ref 0–1)
Lymphs Abs: 1.4 10*3/uL (ref 0.7–4.0)
Monocytes Relative: 6 % (ref 3–12)
Neutro Abs: 3.7 10*3/uL (ref 1.7–7.7)
Neutrophils Relative %: 67 % (ref 43–77)

## 2011-08-26 LAB — COMPREHENSIVE METABOLIC PANEL
Alkaline Phosphatase: 61 U/L (ref 39–117)
BUN: 24 mg/dL — ABNORMAL HIGH (ref 6–23)
Calcium: 9.2 mg/dL (ref 8.4–10.5)
Creatinine, Ser: 0.92 mg/dL (ref 0.4–1.5)
Glucose, Bld: 104 mg/dL — ABNORMAL HIGH (ref 70–99)
Total Protein: 6.5 g/dL (ref 6.0–8.3)

## 2011-08-26 LAB — CBC
HCT: 41.8 % (ref 39.0–52.0)
Hemoglobin: 13.7 g/dL (ref 13.0–17.0)
MCHC: 32.8 g/dL (ref 30.0–36.0)
MCV: 96.1 fL (ref 78.0–100.0)
RDW: 14.7 % (ref 11.5–15.5)

## 2011-08-26 LAB — URINALYSIS, ROUTINE W REFLEX MICROSCOPIC
Ketones, ur: NEGATIVE mg/dL
Nitrite: NEGATIVE
pH: 7 (ref 5.0–8.0)

## 2011-08-26 LAB — PROTIME-INR: INR: 1 (ref 0.00–1.49)

## 2011-09-05 ENCOUNTER — Other Ambulatory Visit: Payer: Self-pay | Admitting: Internal Medicine

## 2011-09-05 MED ORDER — ALLOPURINOL 300 MG PO TABS: 300.0000 mg | ORAL_TABLET | Freq: Every day | ORAL | Status: DC

## 2011-09-05 NOTE — Telephone Encounter (Signed)
The pt called and is requesting Allopurinol 300 mg and Celebrex 200 mg refilled to the pharmacy he uses in [facility].

## 2011-09-05 NOTE — Telephone Encounter (Signed)
Allopurinol 300 mg dispense 90. Celebrex 200 mg dispense 60. Celebrex should be taken for no more than 7 days @  a time, as needed only. Regular, daily usage has increased cardiac &  gastrointestinal ( ulcer) risk. Please label

## 2011-09-05 NOTE — Telephone Encounter (Signed)
Dr.Hopper advise on refill request for Celebrex, last filled on 08/06/11 #60/1 refill

## 2011-09-06 MED ORDER — CELECOXIB 200 MG PO CAPS: ORAL_CAPSULE | ORAL | Status: DC

## 2011-09-06 NOTE — Telephone Encounter (Signed)
RXs sent.

## 2011-11-25 ENCOUNTER — Telehealth: Payer: Self-pay | Admitting: Internal Medicine

## 2011-11-25 MED ORDER — CELECOXIB 200 MG PO CAPS: ORAL_CAPSULE | ORAL | Status: DC

## 2011-11-25 NOTE — Telephone Encounter (Signed)
Pls send refill of celebrex to [facility], [place] [phone]

## 2011-11-25 NOTE — Telephone Encounter (Signed)
RX sent

## 2011-12-05 ENCOUNTER — Other Ambulatory Visit: Payer: Self-pay | Admitting: Internal Medicine

## 2012-03-16 ENCOUNTER — Telehealth: Payer: Self-pay | Admitting: Internal Medicine

## 2012-03-16 ENCOUNTER — Other Ambulatory Visit: Payer: Self-pay | Admitting: Internal Medicine

## 2012-03-16 MED ORDER — ALLOPURINOL 300 MG PO TABS: 300.0000 mg | ORAL_TABLET | Freq: Every day | ORAL | Status: DC

## 2012-03-16 NOTE — Telephone Encounter (Signed)
refill for Allopurinol 300MG  Tablets Qty 90 Take 1-tablet by mouth daily  Last filled 1.27.2013 Last OV 8.14.2012

## 2012-03-16 NOTE — Telephone Encounter (Signed)
Taking to dr., address is [employer]

## 2012-03-16 NOTE — Telephone Encounter (Signed)
RX sent

## 2012-04-29 ENCOUNTER — Other Ambulatory Visit: Payer: Self-pay | Admitting: Internal Medicine

## 2012-06-18 ENCOUNTER — Other Ambulatory Visit: Payer: Self-pay | Admitting: Internal Medicine

## 2012-06-18 NOTE — Telephone Encounter (Signed)
LIPID/HEP 272.4/995.20  

## 2012-07-31 ENCOUNTER — Other Ambulatory Visit: Payer: Self-pay | Admitting: Internal Medicine

## 2012-08-01 NOTE — Telephone Encounter (Signed)
Patient needs to schedule a CPX appointment

## 2012-08-09 ENCOUNTER — Encounter: Payer: Self-pay | Admitting: Gastroenterology

## 2012-09-10 ENCOUNTER — Telehealth: Payer: Self-pay | Admitting: Internal Medicine

## 2012-09-10 MED ORDER — ALLOPURINOL 300 MG PO TABS: 300.0000 mg | ORAL_TABLET | Freq: Every day | ORAL | Status: DC

## 2012-09-10 MED ORDER — SIMVASTATIN 40 MG PO TABS: ORAL_TABLET | ORAL | Status: DC

## 2012-09-10 NOTE — Telephone Encounter (Signed)
Refill: Simvastatin 40 mg tablets. Take 1 tablet by mouth every day. Qty 30. Last fill 9.23.13  Allopurinol 300 mg tablets. Take 1 tablet by mouth daily. Qty 90. Last fill 7.28.13

## 2012-09-10 NOTE — Telephone Encounter (Signed)
RX's sent. Patient will need to schedule a CPX and fasting labs (done at the same time as CPX)

## 2012-09-14 ENCOUNTER — Other Ambulatory Visit: Payer: Self-pay

## 2012-09-14 MED ORDER — ALLOPURINOL 300 MG PO TABS: 300.0000 mg | ORAL_TABLET | Freq: Every day | ORAL | Status: DC

## 2012-09-14 MED ORDER — SIMVASTATIN 40 MG PO TABS: ORAL_TABLET | ORAL | Status: DC

## 2012-09-14 NOTE — Telephone Encounter (Signed)
Rx sent.    MW 

## 2012-09-14 NOTE — Telephone Encounter (Signed)
Pt states in [place] until Spring can't get back for appt until then. Pt also states needs Simvastatin and Allopurinol script for atleast 90 day supply at a time.  Plz advise            MW

## 2012-09-14 NOTE — Telephone Encounter (Signed)
Refill #90 of each please

## 2012-09-25 ENCOUNTER — Telehealth: Payer: Self-pay | Admitting: Internal Medicine

## 2012-09-25 MED ORDER — LOSARTAN POTASSIUM 50 MG PO TABS: ORAL_TABLET | ORAL | Status: AC

## 2012-09-25 NOTE — Telephone Encounter (Signed)
RX sent  Side Note: Patient will need to schedule a CPX

## 2012-09-25 NOTE — Telephone Encounter (Signed)
Refill: Losartan 50 mg tablets. Take 1 tablet by mouth daily. Qty 90. Last fill 7.29.13

## 2012-10-01 ENCOUNTER — Other Ambulatory Visit: Payer: Self-pay | Admitting: Internal Medicine

## 2012-10-02 NOTE — Telephone Encounter (Signed)
OK # 60 qd prn only

## 2012-10-02 NOTE — Telephone Encounter (Signed)
OV/labs 07/05/11 last filled 12/05/11  #60 x 1   Plz advise    MW

## 2012-12-06 ENCOUNTER — Other Ambulatory Visit: Payer: Self-pay | Admitting: Internal Medicine

## 2012-12-06 MED ORDER — CELECOXIB 200 MG PO CAPS: ORAL_CAPSULE | ORAL | Status: AC

## 2012-12-06 NOTE — Telephone Encounter (Signed)
refill CELEBREX 200 MG TAKE ONE CAPSULE BY MOUTH DAILY AS NEEDED . AVOID DAILY USE #60 last fill 11.14.13

## 2012-12-11 ENCOUNTER — Other Ambulatory Visit: Payer: Self-pay | Admitting: Internal Medicine

## 2012-12-11 MED ORDER — SIMVASTATIN 40 MG PO TABS: ORAL_TABLET | ORAL | Status: AC

## 2012-12-11 NOTE — Telephone Encounter (Signed)
refill Simvastatin (Tab) 40 MG TAKE 1 TABLET BY MOUTH EVERY DAY #90 last fill 10.25.13--no appt scheduled

## 2013-07-12 ENCOUNTER — Other Ambulatory Visit: Payer: Self-pay | Admitting: *Deleted

## 2013-07-12 NOTE — Telephone Encounter (Signed)
Patient has not been in for an ov since 07/05/2011 and accoutring to our office protocols this patient cant have this refill he must find a local doctor.

## 2013-07-12 NOTE — Telephone Encounter (Signed)
I have reviewed patients chart and per Dr. Alfonse Flavors this patient cant not have any refills until he has a OV.   Ag cma

## 2013-08-13 ENCOUNTER — Telehealth: Payer: Self-pay | Admitting: Internal Medicine

## 2013-08-13 NOTE — Telephone Encounter (Signed)
Refill: Allopruinol 200 mg tablets. Take 1 tablet by mouth daily. Qty 30. Last fill 07-11-13

## 2013-08-14 NOTE — Telephone Encounter (Signed)
Called spoke with Okey Regal) at the [facility] in [place] informing her that we will not be able to refill the med that the pt is requesting b/c we have not seen the pt seen 2012.  He will need an OV and lab work.  Okey Regal stated that she will inform the pt.//AB/CMA

## 2013-10-09 ENCOUNTER — Other Ambulatory Visit: Payer: Self-pay | Admitting: Internal Medicine

## 2013-10-10 NOTE — Telephone Encounter (Signed)
Refill for Simvastatin denied because patient needs OV. Last OV was 06/2011

## 2016-10-06 ENCOUNTER — Inpatient Hospital Stay: Admit: 2016-10-06 | Discharge: 2016-10-06 | Attending: Neurology | Primary: Internal Medicine

## 2016-10-06 DIAGNOSIS — R269 Unspecified abnormalities of gait and mobility: Secondary | ICD-10-CM

## 2016-10-06 DIAGNOSIS — Z7982 Long term (current) use of aspirin: Secondary | ICD-10-CM

## 2016-10-06 DIAGNOSIS — M109 Gout, unspecified: Secondary | ICD-10-CM

## 2016-10-06 DIAGNOSIS — R202 Paresthesia of skin: Secondary | ICD-10-CM

## 2016-10-06 DIAGNOSIS — M810 Age-related osteoporosis without current pathological fracture: Secondary | ICD-10-CM

## 2016-10-06 DIAGNOSIS — G5603 Carpal tunnel syndrome, bilateral upper limbs: Principal | ICD-10-CM

## 2016-10-06 DIAGNOSIS — H9319 Tinnitus, unspecified ear: Secondary | ICD-10-CM

## 2016-10-06 DIAGNOSIS — I10 Essential (primary) hypertension: Principal | ICD-10-CM

## 2016-10-06 DIAGNOSIS — R29898 Other symptoms and signs involving the musculoskeletal system: Secondary | ICD-10-CM

## 2016-10-06 DIAGNOSIS — M4712 Other spondylosis with myelopathy, cervical region: Secondary | ICD-10-CM

## 2016-10-06 DIAGNOSIS — Z8249 Family history of ischemic heart disease and other diseases of the circulatory system: Secondary | ICD-10-CM

## 2016-10-06 DIAGNOSIS — Z79899 Other long term (current) drug therapy: Secondary | ICD-10-CM

## 2016-10-06 DIAGNOSIS — E7800 Pure hypercholesterolemia, unspecified: Secondary | ICD-10-CM

## 2016-10-06 DIAGNOSIS — R200 Anesthesia of skin: Secondary | ICD-10-CM

## 2016-10-06 MED ORDER — LOSARTAN POTASSIUM 50 MG PO TABS
Freq: Every day | ORAL
Start: 2016-10-06 — End: ?

## 2016-10-06 MED ORDER — ALLOPURINOL 100 MG PO TABS
Freq: Every day | ORAL
Start: 2016-10-06 — End: ?

## 2016-10-06 MED ORDER — CELECOXIB 200 MG PO CAPS
1 refills
Start: 2016-10-06 — End: ?

## 2016-10-06 MED ORDER — CYANOCOBALAMIN 100 MCG PO TABS
Freq: Every day | ORAL
Start: 2016-10-06 — End: ?

## 2016-10-06 MED ORDER — AMLODIPINE BESYLATE 5 MG PO TABS
Freq: Every day | ORAL
Start: 2016-10-06 — End: ?

## 2016-10-06 MED ORDER — BELSOMRA 15 MG PO TABS
0 refills
Start: 2016-10-06 — End: ?

## 2016-10-06 MED ORDER — ASPIRIN 81 MG PO TABS
Freq: Every day | ORAL
Start: 2016-10-06 — End: ?

## 2016-10-06 NOTE — Progress Notes
CHIEF COMPLAINT:   Chief Complaint   Patient presents with   ? 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  Denies bowel/bladder dysfunction, visual changes, chewing or swallowing difficulties, or falls.  Occasional nocturnal sensory complaints in hands; shakes hands to relieve sensations.  Recent hx of right sided neck pain with radiation to right shoulder, now much resolved spontaneously.  CT of neck done last month showed "much arthritis".   Arms affected more than legs, but some occasional tingling/numbness in toes.  Possibly some mild worsening over the last month.  No FH of neuropathy.    PAST MEDICAL HISTORY:    Past Medical History:   Diagnosis Date   ? Gout    ? Hypercholesteremia    ? Hypertension    ? Osteoporosis    ? Tinnitus      FAMILY HISTORY:   Family History   Problem Relation Age of Onset   ? Coronary Artery Disease Mother      PERSONAL AND SOCIAL HISTORY:   reports that he has never smoked. He has never used smokeless tobacco. He reports that he drinks alcohol. He reports that he does not use illicit drugs.  ALLERGIES:No Known Allergies    MEDICATIONS:   Current Med List   Medication Sig   ? allopurinol (ZYLOPRIM) 100 MG Tablet Take by mouth daily.   ? amLODIPine (NORVASC) 5 MG Tablet Take by mouth daily.   ? aspirin 81 MG Tablet Take by mouth daily.   ? BELSOMRA 15 MG Tablet TAKE 1 TABLET BY MOUTH NIGHTLY AS NEEDED FOR SLEEP.   ? celecoxib (CeleBREX) 200 MG Capsule TAKE ONE CAPSULE BY MOUTH EVERY DAY   ? cyanocobalamin (VITAMIN B-12) 100 MCG Tablet Take by mouth daily.   ? losartan (COZAAR) 50 MG Tablet Take by mouth daily.         REVIEW OF SYSTEM: Other constitution, HEENT, cardiovascular,

## 2016-10-06 NOTE — Progress Notes
gastrointestinal, genitourinary, hematologic, endocrine, lymphatic, musculoskeletal, and psychiatric symptoms were reviewed and unremarkable except the above-mentioned.    PHYSICAL EXAMINATION:    VITAL SIGNS:   Vitals:    10/06/16 0947   BP: 138/81   Pulse: 64   Resp: 16   Temp: 36.3 ?C (97.3 ?F)   Weight: 86.6 kg (191 lb)   Height: 1.753 m (5\' 9" )     GENERAL APPEARANCE: Coherent, appropriately dressed. Able to provide a history.  CARDIAC: Heart: regular rate and rhythm. No murmurs or bruits appreciate.   NEUROLOGIC:  MENTAL STATUS: Alert. Oriented to person, place, and time.  Intact recent and remote memory.  Intact attention span and concentration. Intact language. Intact fund of knowledge.  CRANIAL NERVES:  Cranial nerves II-XII were normal including fundoscopy and EOM.  Marked horizontal nystagmus bilaterally.    MOTOR:  There was neither drift nor tremor of the outstretched arms.    UPPER EXTREMITY                                           DELT                BIC         TRI       INFRASP   SUPRASP   BRACH     ECR              FCR         RIGHT 5 5 5    5 5    LEFT 5 5 5    5 5                               FPL              FDI            ADM      APB  RIGHT 5 5 5 4         LEFT 5 5 5  5-            LOWER EXTREMITY    Mild increase in resting tone in both legs.                             IP           ADDMAG   QUADS   HAMST   TIBANT   PERLONG  GASTROC  TIBPOST          RIGHT 4  5 5- 5  5    LEFT 4-  5 4+ 5  5          GLUTMED  GLUTMAX   EHL  RIGHT  5          LEFT  5            SENSORY: Intact pinprick, cold and light touch.  Vibration appreciation absent at both great toes, reduced at both knees and normal at knees.  Mild reduction in vibration at the fingertips.   Joint position sense normal at great toes and fingertips.  REFLEXES:   R  L   Biceps  tr  tr   Brachiorad Ab  tr   Triceps  1  1+   Quad  2+  2+   Ankles  2+  2+  Plantar responses flexor.  No crossed adductor response.

## 2016-10-06 NOTE — Progress Notes
COORDINATION: Intact finger to nose and heel to shin maneuver.  GAIT: Normal base but stiff legged gait.  Able to walk on heels and toes.  Romberg positive.    NEUROPHYSIOLOGY FINDINGS:    The EDx study was performed to exclude the presence of peripheral nerve disease to explain the balance difficulties and sensory complaints in both upper and lower limbs.  Additionally, I suspected the thumb weakness may be due to CTS.    1. There was focal slowing of bilateral median mixed nerve conduction across the wrist segment.  The amplitude of the median sensory potential were normal.  2. Normal left sural sensory conduction study.  3. Prolonged bilateral median motor distal latencies with normal motor potential amplitudes.   4. Normal left peroneal motor conduction study, including peroneal motor potential amplitude.    5. Normal left peroneal and bilateral median F wave minimal latencies.    Needle EMG was deferred as the information it would provide would not change my clinical decision making.    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  No evidence of a generalized neuropathy, although there are bilateral median nerve entrapments at the wrists.  These are incidental findings but need to be followed as there is mild weakness in the APB muscle bilaterally.    Pt will send his cervical spine CT done last month for review.  I will call him after reviewing.      We spent time discussing the possible dx and therapeutic options.    Thank you for the opportunity to care for your patient.    Sincerely,    Wynonia SoursAlan R. Berger, M.D.  Professor of Neurology  Director, [facility]  [facility]

## 2016-10-07 NOTE — Addendum Note
Encounter addended by: Hortencia ConradiMorgan, Thomisha, MA on: 10/07/2016 12:11 PM<BR>     Actions taken: Charge Capture section accepted

## 2016-11-02 DIAGNOSIS — M4712 Other spondylosis with myelopathy, cervical region: Principal | ICD-10-CM

## 2016-11-10 ENCOUNTER — Encounter: Attending: Neurological Surgery | Primary: Internal Medicine

## 2021-04-19 ENCOUNTER — Other Ambulatory Visit: Payer: Self-pay

## 2021-04-19 ENCOUNTER — Emergency Department (HOSPITAL_COMMUNITY): Payer: Medicare Other

## 2021-04-19 ENCOUNTER — Encounter (HOSPITAL_COMMUNITY): Payer: Self-pay | Admitting: Emergency Medicine

## 2021-04-19 ENCOUNTER — Emergency Department (HOSPITAL_COMMUNITY)
Admission: EM | Admit: 2021-04-19 | Discharge: 2021-04-19 | Disposition: A | Discharge status: Home / Self Care | Payer: Medicare Other | Attending: Emergency Medicine | Admitting: Emergency Medicine

## 2021-04-19 DIAGNOSIS — R072 Precordial pain: Secondary | ICD-10-CM | POA: Diagnosis present

## 2021-04-19 DIAGNOSIS — I10 Essential (primary) hypertension: Secondary | ICD-10-CM | POA: Insufficient documentation

## 2021-04-19 DIAGNOSIS — Z96649 Presence of unspecified artificial hip joint: Secondary | ICD-10-CM | POA: Insufficient documentation

## 2021-04-19 DIAGNOSIS — E119 Type 2 diabetes mellitus without complications: Secondary | ICD-10-CM | POA: Insufficient documentation

## 2021-04-19 DIAGNOSIS — Z79899 Other long term (current) drug therapy: Secondary | ICD-10-CM | POA: Insufficient documentation

## 2021-04-19 DIAGNOSIS — Z951 Presence of aortocoronary bypass graft: Secondary | ICD-10-CM | POA: Diagnosis not present

## 2021-04-19 DIAGNOSIS — I2510 Atherosclerotic heart disease of native coronary artery without angina pectoris: Secondary | ICD-10-CM | POA: Insufficient documentation

## 2021-04-19 LAB — BASIC METABOLIC PANEL
Anion gap: 9 (ref 5–15)
BUN: 13 mg/dL (ref 8–23)
CO2: 22 mmol/L (ref 22–32)
Calcium: 8.7 mg/dL — ABNORMAL LOW (ref 8.9–10.3)
Chloride: 103 mmol/L (ref 98–111)
Creatinine, Ser: 0.83 mg/dL (ref 0.61–1.24)
GFR, Estimated: >60 mL/min (ref >60)
Glucose, Bld: 106 mg/dL — ABNORMAL HIGH (ref 70–99)
Potassium: 4 mmol/L (ref 3.5–5.1)
Sodium: 134 mmol/L — ABNORMAL LOW (ref 135–145)

## 2021-04-19 LAB — CBC
HCT: 42.8 % (ref 39.0–52.0)
Hemoglobin: 14.4 g/dL (ref 13.0–17.0)
MCH: 32.7 pg (ref 26.0–34.0)
MCHC: 33.6 g/dL (ref 30.0–36.0)
MCV: 97.1 fL (ref 80.0–100.0)
Platelets: 143 10*3/uL — ABNORMAL LOW (ref 150–400)
RBC: 4.41 MIL/uL (ref 4.22–5.81)
RDW: 14.9 % (ref 11.5–15.5)
WBC: 7.3 10*3/uL (ref 4.0–10.5)
nRBC: 0 % (ref 0.0–0.2)

## 2021-04-19 LAB — TROPONIN I (HIGH SENSITIVITY)
Troponin I (High Sensitivity): 5 ng/L (ref <18)
Troponin I (High Sensitivity): 4 ng/L (ref <18)

## 2021-04-19 MED ORDER — ALUM & MAG HYDROXIDE-SIMETH 200-200-20 MG/5ML PO SUSP
30.0000 mL | Freq: Once | ORAL | Status: AC
  Administered 2021-04-19: 30 mL via ORAL
  Filled 2021-04-19: qty 30

## 2021-04-19 MED ORDER — NITROGLYCERIN 0.4 MG SL SUBL
0.4000 mg | SUBLINGUAL_TABLET | SUBLINGUAL | Status: DC | PRN
  Filled 2021-04-19: qty 1

## 2021-04-19 MED ORDER — ASPIRIN 81 MG PO CHEW
324.0000 mg | CHEWABLE_TABLET | Freq: Once | ORAL | Status: AC
  Administered 2021-04-19: 324 mg via ORAL
  Filled 2021-04-19: qty 4

## 2021-04-19 NOTE — Discharge Instructions (Signed)
Please read and follow all provided instructions.  Your diagnoses today include:  1. Precordial pain     Tests performed today include:  An EKG of your heart  A chest x-ray  Cardiac enzymes - a blood test for heart muscle damage  Blood counts and electrolytes  Vital signs. See below for your results today.   Medications prescribed:   None  Take any prescribed medications only as directed.  Follow-up instructions: Please follow-up with your primary care provider as soon as you can for further evaluation of your symptoms.   Return instructions:  SEEK IMMEDIATE MEDICAL ATTENTION IF:  You have severe chest pain, especially if the pain is crushing or pressure-like and spreads to the arms, back, neck, or jaw, or if you have sweating, nausea (feeling sick to your stomach), or shortness of breath. THIS IS AN EMERGENCY. Don't wait to see if the pain will go away. Get medical help at once. Call 911 or 0 (operator). DO NOT drive yourself to the hospital.   Your chest pain gets worse and does not go away with rest.   You have an attack of chest pain lasting longer than usual, despite rest and treatment with the medications your caregiver has prescribed.   You wake from sleep with chest pain or shortness of breath.  You feel dizzy or faint.  You have chest pain not typical of your usual pain for which you originally saw your caregiver.   You have any other emergent concerns regarding your health.  Additional Information: Chest pain comes from many different causes. Your caregiver has diagnosed you as having chest pain that is not specific for one problem, but does not require admission.  You are at low risk for an acute heart condition or other serious illness.   Your vital signs today were: BP 127/86   Pulse 64   Temp 97.8 F (36.6 C) (Oral)   Resp 14   Ht 5\' 9"  (1.753 m)   Wt 81.6 kg   SpO2 96%   BMI 26.58 kg/m  If your blood pressure (BP) was elevated above 135/85 this  visit, please have this repeated by your doctor within one month. --------------

## 2021-04-19 NOTE — ED Provider Notes (Signed)
I provided a substantive portion of the care of this patient.  I personally performed the entirety of the medical decision making for this encounter.  EKG Interpretation  Date/Time:  Monday Apr 19 2021 06:07:37 EDT Ventricular Rate:  74 PR Interval:  138 QRS Duration: 137 QT Interval:  404 QTC Calculation: 449 R Axis:   41 Text Interpretation: Sinus rhythm Right bundle branch block 12 Lead; Mason-Likar No significant change since last tracing Confirmed by Melene Plan [phone]) on 04/19/2021 6:80:64 AM 80 year old male presents with several weeks of intermittent chest discomfort described as indigestion.  Became acutely worse last night after he had 2 [employer] donuts.  No associated cardiac symptoms.  We will perform delta troponin here and discharge.  He is comfortable with this plan   Lorre Nick, MD 04/19/21 [phone]

## 2021-04-19 NOTE — ED Provider Notes (Signed)
[facility]-EMERGENCY DEPT Provider Note   CSN: [id number] Arrival date & time: 04/19/21  0557     History Chief Complaint  Patient presents with  . Chest Pain    Carl Wilcox is a 80 y.o. male.  Patient with history of high cholesterol and hypertension, coronary artery disease with previous stenting, formerly of [place] now residing in [place], [place] and currently in town visiting grandkids --presents the emergency department for evaluation of chest pain.  Patient reports having intermittent indigestion over the past couple of weeks that is atypical for him.  This is described as a burning pain in the chest, can occur at rest and with activity.  No associated vomiting or diaphoresis.  No radiation of the pain.  Patient awoke around 5:30 AM today with chest pain in the mid chest.  Again no radiation, vomiting, or diaphoresis.  This concerned him because he had a friend who woke up with similar symptoms a couple of weeks ago and passed away.  Patient denies shortness of breath or cough.  Symptoms have nearly resolved.  No fevers or chills.  Onset of symptoms acute.  Nothing make symptoms better or worse.        Past Medical History:  Diagnosis Date  . Fasting hyperglycemia   . Gout   . Hyperlipidemia   . Hypertension   . Right lumbar radiculopathy    Dr.Olin and Ramos    Patient Active Problem List   Diagnosis Date Noted  . URINARY FREQUENCY 10/13/2010  . URINARY URGENCY 10/13/2010  . HYPERGLYCEMIA, MILD 10/13/2010  . DIABETES MELLITUS, TYPE II, CONTROLLED 06/29/2010  . OTHER AND UNSPECIFIED HYPERLIPIDEMIA 10/19/2007  . HYPERTENSION, ESSENTIAL NOS 10/19/2007  . LUMBAR RADICULOPATHY, RIGHT 10/19/2007  . GOUT 08/08/2007    Past Surgical History:  Procedure Laterality Date  . COLONOSCOPY  2003 & 2011   Neg  . TOTAL HIP ARTHROPLASTY  1998       Family History  Problem Relation Age of Onset  . Hypertension Mother   . Heart failure Mother         Smoker,ETOH Excess    Social History   Tobacco Use  . Smoking status: Never Smoker  . Smokeless tobacco: Never Used  Substance Use Topics  . Alcohol use: Yes    Alcohol/week: 2.0 standard drinks    Types: 2 Shots of liquor per week  . Drug use: No    Home Medications Prior to Admission medications   Medication Sig Start Date End Date Taking? Authorizing Provider  allopurinol (ZYLOPRIM) 300 MG tablet TAKE 1 TABLET BY MOUTH DAILY 12/11/12   Pecola Lawless, MD  aspirin 81 MG tablet Take 81 mg by mouth daily.      [provider]  CELEBREX 200 MG capsule TAKE ONE CAPSULE BY MOUTH DAILY AS NEEDED . AVOID DAILY USE 10/01/12   Pecola Lawless, MD  celecoxib (CELEBREX) 200 MG capsule   08/06/11   Pecola Lawless, MD  celecoxib (CELEBREX) 200 MG capsule 1 by mouth daily as needed. Celebrex should be taken for no more than 7 days @  a time, as needed only. Regular, daily usage has increased cardiac &  gastrointestinal ( ulcer) risk. APPOINTMENT OVER DUE 12/06/12   Pecola Lawless, MD  glucose blood (FREESTYLE TEST STRIPS) test strip 1 each by Other route as needed. Use as instructed     [provider]  losartan (COZAAR) 50 MG tablet 1 by mouth daily APPOINTMENT OVERDUE  09/25/12   Pecola Lawless, MD  sildenafil (VIAGRA) 100 MG tablet Take 100 mg by mouth. 1/2-1 by as needed     [provider]  simvastatin (ZOCOR) 40 MG tablet LABS DUE, 1 by mouth daily 12/11/12   Pecola Lawless, MD    Allergies    Codeine and Amlodipine besy-benazepril hcl  Review of Systems   Review of Systems  Constitutional: Negative for diaphoresis and fever.  Eyes: Negative for redness.  Respiratory: Negative for cough and shortness of breath.   Cardiovascular: Positive for chest pain. Negative for palpitations and leg swelling.  Gastrointestinal: Negative for abdominal pain, nausea and vomiting.  Genitourinary: Negative for dysuria.  Musculoskeletal: Negative for back pain  and neck pain.  Skin: Negative for rash.  Neurological: Negative for syncope and light-headedness.  Psychiatric/Behavioral: The patient is not nervous/anxious.     Physical Exam Updated Vital Signs BP 139/86 (BP Location: Left Arm)   Pulse 77   Temp 97.8 F (36.6 C) (Oral)   Resp 16   Ht 5\' 9"  (1.753 m)   Wt 81.6 kg   SpO2 98%   BMI 26.58 kg/m   Physical Exam Vitals and nursing note reviewed.  Constitutional:      Appearance: He is well-developed. He is not diaphoretic.  HENT:     Head: Normocephalic and atraumatic.     Mouth/Throat:     Mouth: Mucous membranes are not dry.  Eyes:     Conjunctiva/sclera: Conjunctivae normal.  Neck:     Vascular: Normal carotid pulses. No carotid bruit or JVD.     Trachea: Trachea normal. No tracheal deviation.  Cardiovascular:     Rate and Rhythm: Normal rate and regular rhythm.     Pulses: No decreased pulses.     Heart sounds: Normal heart sounds, S1 normal and S2 normal. Heart sounds not distant. No murmur heard.   Pulmonary:     Effort: Pulmonary effort is normal. No respiratory distress.     Breath sounds: Normal breath sounds. No wheezing.  Chest:     Chest wall: No tenderness.  Abdominal:     General: Bowel sounds are normal.     Palpations: Abdomen is soft.     Tenderness: There is no abdominal tenderness. There is no guarding or rebound.  Musculoskeletal:     Cervical back: Normal range of motion and neck supple. No muscular tenderness.  Skin:    General: Skin is warm and dry.     Coloration: Skin is not pale.  Neurological:     Mental Status: He is alert.     ED Results / Procedures / Treatments   Labs (all labs ordered are listed, but only abnormal results are displayed) Labs Reviewed  BASIC METABOLIC PANEL - Abnormal; Notable for the following components:      Result Value   Sodium 134 (*)    Glucose, Bld 106 (*)    Calcium 8.7 (*)    All other components within normal limits  CBC - Abnormal; Notable for  the following components:   Platelets 143 (*)    All other components within normal limits  TROPONIN I (HIGH SENSITIVITY)  TROPONIN I (HIGH SENSITIVITY)    EKG EKG Interpretation  Date/Time:  Monday Apr 19 2021 06:07:37 EDT Ventricular Rate:  74 PR Interval:  138 QRS Duration: 137 QT Interval:  404 QTC Calculation: 449 R Axis:   41 Text Interpretation: Sinus rhythm Right bundle branch block 12 Lead; Mason-Likar No significant  change since last tracing Confirmed by Melene Plan [phone]) on 04/19/2021 6:14:33 AM   Radiology No results found.  Procedures Procedures   Medications Ordered in ED Medications  aspirin chewable tablet 324 mg (has no administration in time range)  nitroGLYCERIN (NITROSTAT) SL tablet 0.4 mg (has no administration in time range)  alum & mag hydroxide-simeth (MAALOX/MYLANTA) 200-200-20 MG/5ML suspension 30 mL (has no administration in time range)    ED Course  I have reviewed the triage vital signs and the nursing notes.  Pertinent labs & imaging results that were available during my care of the patient were reviewed by me and considered in my medical decision making (see chart for details).  Patient seen and examined. Work-up initiated. Medications ordered. EKG reviewed, not appreciably changed from 2012.   Vital signs reviewed and are as follows: BP 139/86 (BP Location: Left Arm)   Pulse 77   Temp 97.8 F (36.6 C) (Oral)   Resp 16   Ht 5\' 9"  (1.753 m)   Wt 81.6 kg   SpO2 98%   BMI 26.58 kg/m   8:45 AM Patient discussed earlier with Dr. . He has seen patient. Feel that patient can be discharged if 2nd troponin in negative.   9:16 AM Troponin 5 >> 4.   Patient and wife updated on results.  Pain remains resolved.  He is comfortable with plan for discharge to home.  They are heading back to [place] in 6 days.  We discussed strict return instructions and to follow-up with cardiologist when he returns.  Patient was counseled to return with  severe chest pain, especially if the pain is crushing or pressure-like and spreads to the arms, back, neck, or jaw, or if they have sweating, nausea, or shortness of breath with the pain. They were encouraged to call 911 with these symptoms.   They were also asked to return if their chest pain gets worse and does not go away with rest, they have an attack of chest pain lasting longer than usual despite rest and treatment with the medications their caregiver has prescribed, if they wake from sleep with chest pain or shortness of breath, if they feel dizzy or faint, if they have chest pain not typical of their usual pain, or if they have any other emergent concerns regarding their health.  The patient verbalized understanding and agreed.     MDM Rules/Calculators/A&P                          Patient with chest pain with indigestion over the past several weeks.  History of coronary artery disease.  Symptoms are atypical overall.  He denies radiation of pain, vomiting, diaphoresis.  Symptoms are nonexertional.  EKG is essentially unchanged from remote EKG 10 years ago.  Troponin negative x2.  Symptoms now resolved.  Feel that patient is low risk from an ACS standpoint today.  Symptoms are atypical for PE and dissection I do not feel he requires further work-up for this today.  As symptoms are resolved and he is doing well, will discharge patient to home and he is comfortable with this plan.  He seems reliable to return if his symptoms recur or worsen.  Final Clinical Impression(s) / ED Diagnoses Final diagnoses:  Precordial pain    Rx / DC Orders ED Discharge Orders    None       [place], PA-C 04/19/21 04/21/21    [id number], MD 04/23/21 [phone]

## 2021-04-19 NOTE — ED Triage Notes (Signed)
Patient arriving complaining of sudden onset of middle chest pain that began around 2 hours ago. Patient denies N/V, pain does not radiate. Patient has hx of 2 stents and feels like he has "indigestion."
